# Patient Record
Sex: Male | Born: 1970 | Race: White | Hispanic: No | Marital: Married | Smoking: Never smoker
Health system: Southern US, Community
[De-identification: ages and names within clinical notes are randomized; demographics above are authoritative.]

## PROBLEM LIST (undated history)

## (undated) DIAGNOSIS — I10 Essential (primary) hypertension: Secondary | ICD-10-CM

## (undated) HISTORY — PX: NO PAST SURGERIES: SHX2092

---

## 2019-01-09 ENCOUNTER — Other Ambulatory Visit: Payer: Self-pay | Admitting: Obstetrics and Gynecology

## 2019-01-09 ENCOUNTER — Ambulatory Visit
Admission: RE | Admit: 2019-01-09 | Discharge: 2019-01-09 | Disposition: A | Discharge status: Home / Self Care | Payer: BC Managed Care – PPO | Source: Ambulatory Visit | Attending: Obstetrics and Gynecology | Admitting: Obstetrics and Gynecology

## 2019-01-09 ENCOUNTER — Other Ambulatory Visit: Payer: Self-pay

## 2019-01-09 DIAGNOSIS — M545 Low back pain, unspecified: Secondary | ICD-10-CM

## 2019-01-09 IMAGING — CR DG LUMBAR SPINE COMPLETE 4+V
5 series · 5 of 5 positions shown · non-contrast
Comparison: None.

CLINICAL DATA: Chronic low back pain radiating to the buttocks and
hips, worse on the right.

EXAM:
LUMBAR SPINE - COMPLETE 4+ VIEW

[t lumbar spine ap]
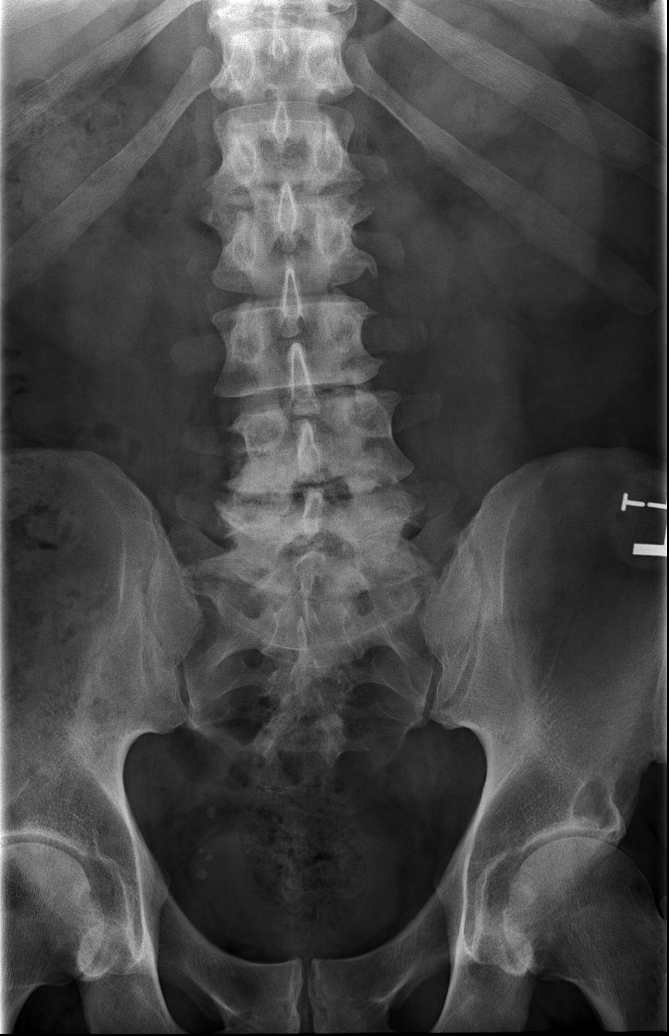

[t lumbar spine obl (1 of 2)]
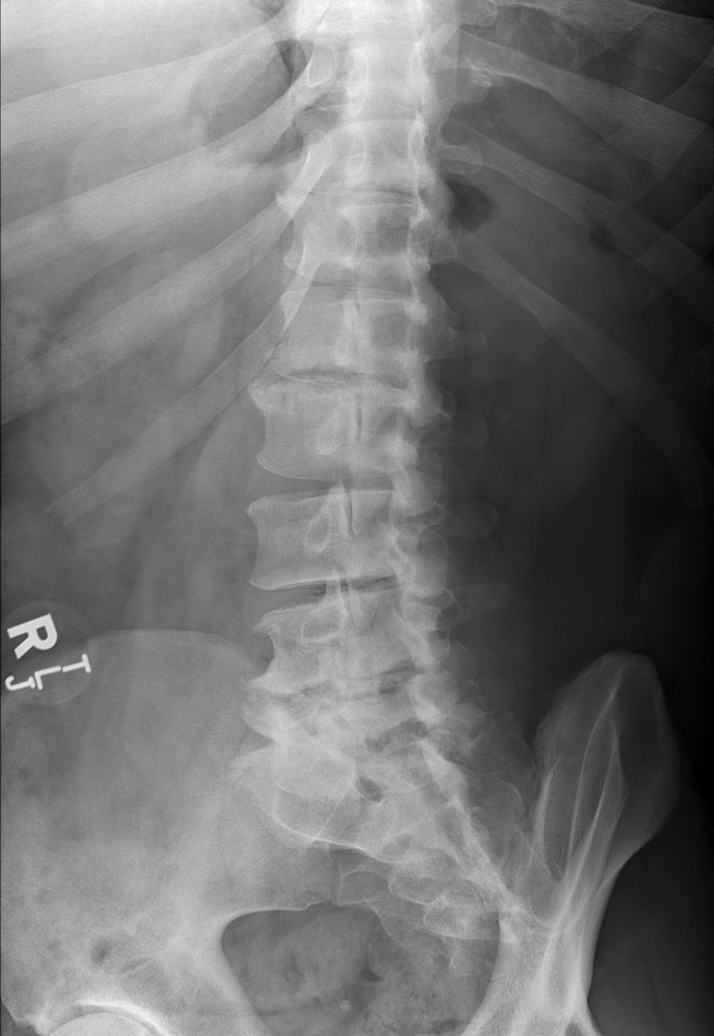

[t lumbar spine obl (2 of 2)]
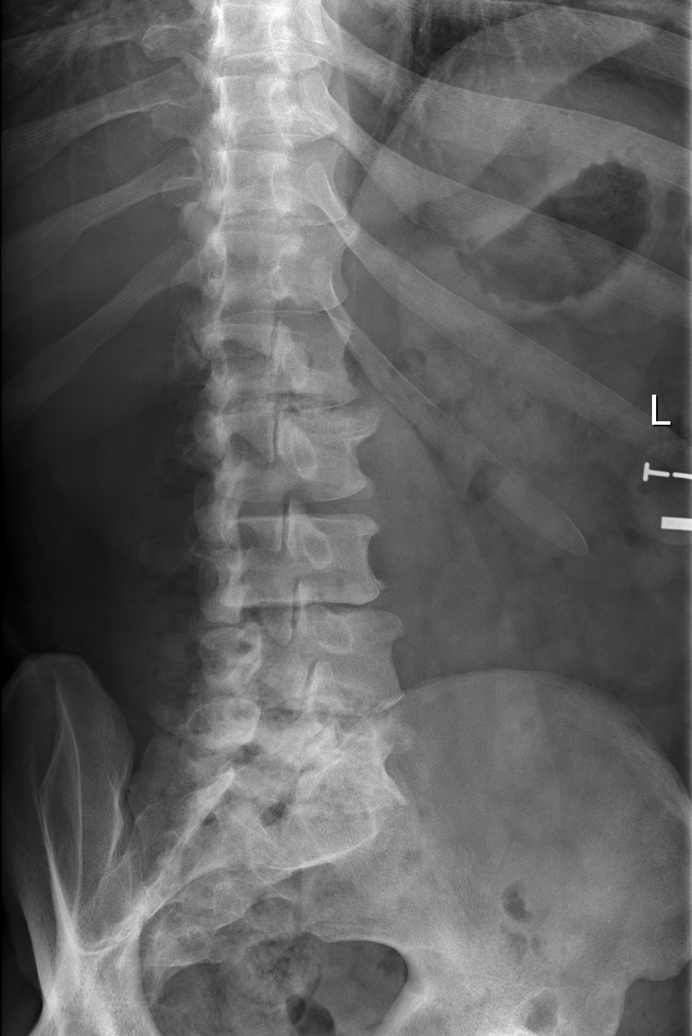

[t lumbar spine lat]
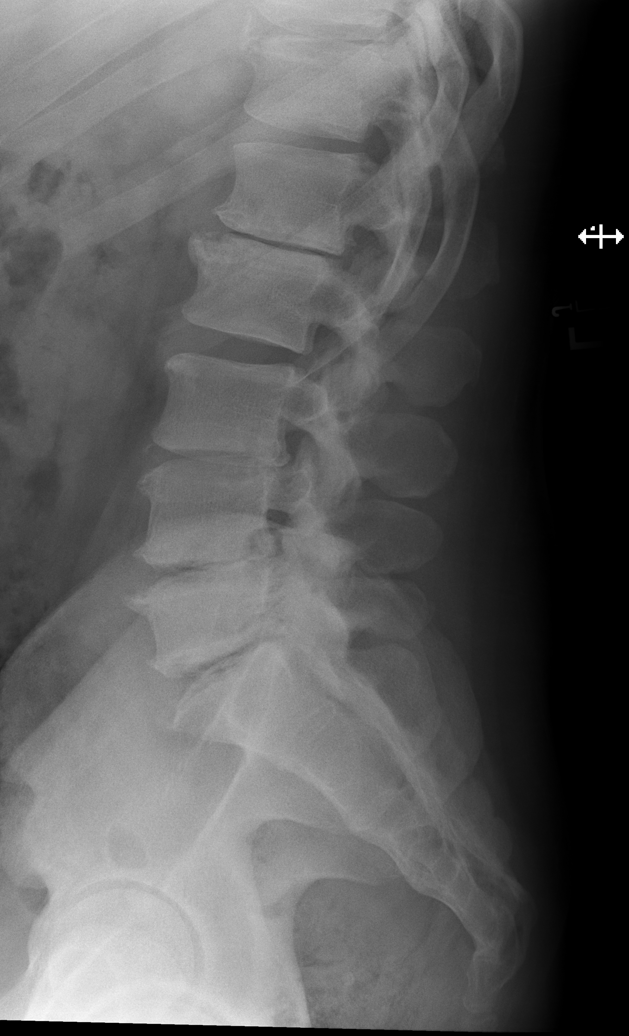

[t lumbar l-5 s-1 spot]
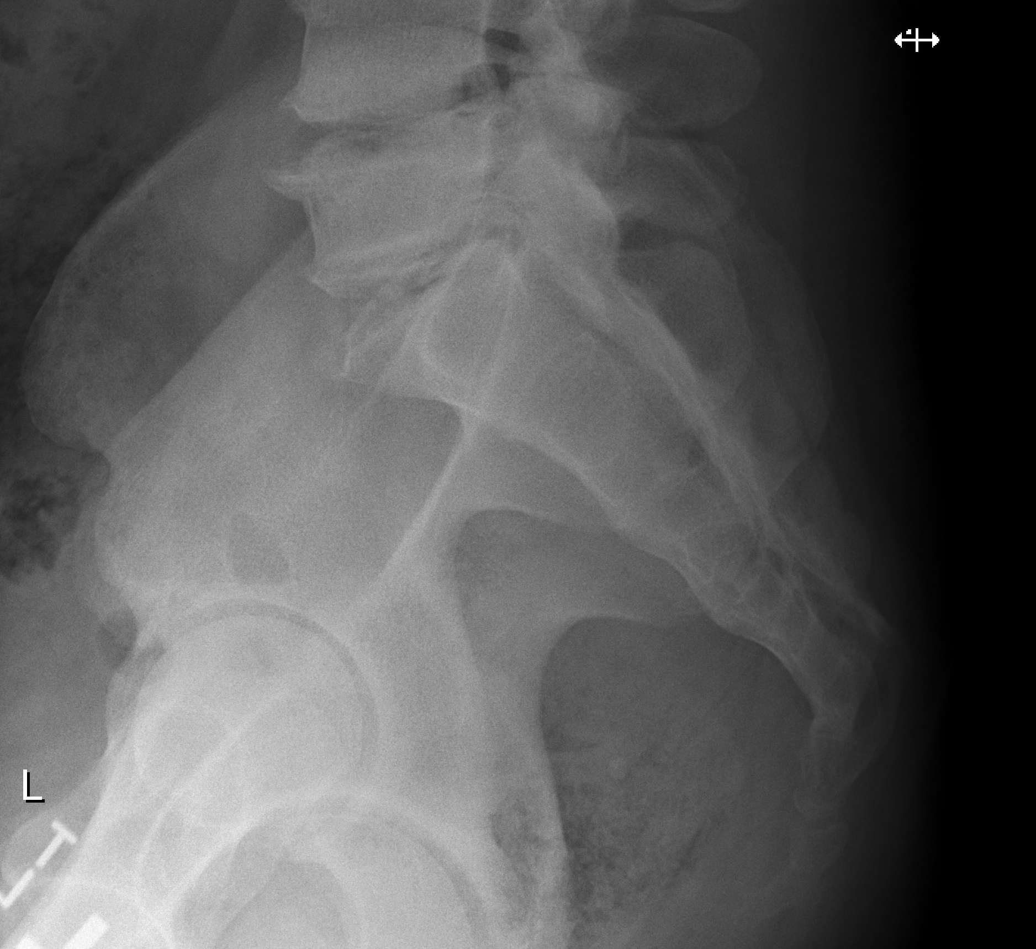

[5 of 5 positions shown; findings below may reference images not displayed]

FINDINGS: Five lumbar type vertebral bodies. Mild thoracolumbar curvature
convex to the right and lower lumbar curvature convex to the left.
Mild rightward translation of L3 relative to L4. 3 mm degenerative
anterolisthesis L1-2. Advanced disc degeneration with loss of disc
height, vacuum phenomenon and endplate sclerosis at L1-2, L4-5 and
L5-S1. Moderate disc degeneration at L3-4. Lower lumbar facet
osteoarthritis. Sacroiliac joints appear negative.
IMPRESSION: Advanced degenerative disc disease at L1-2, L4-5 and L5-S1. Findings
could certainly be associated with back pain and potentially with
nerve compression or referred pain.

## 2019-02-23 ENCOUNTER — Other Ambulatory Visit: Payer: Self-pay | Admitting: Neurological Surgery

## 2019-02-23 DIAGNOSIS — M48061 Spinal stenosis, lumbar region without neurogenic claudication: Secondary | ICD-10-CM

## 2019-02-24 ENCOUNTER — Ambulatory Visit
Admission: RE | Admit: 2019-02-24 | Discharge: 2019-02-24 | Disposition: A | Discharge status: Home / Self Care | Payer: BC Managed Care – PPO | Source: Ambulatory Visit | Attending: Neurological Surgery | Admitting: Neurological Surgery

## 2019-02-24 DIAGNOSIS — M48061 Spinal stenosis, lumbar region without neurogenic claudication: Secondary | ICD-10-CM

## 2019-02-24 IMAGING — CT CT L SPINE W/O CM
4 of 5 series · 13 of 33 positions shown, 15 images · non-contrast
Comparison: Plain films lumbar spine [DATE].

CLINICAL DATA: Chronic low back and intermittent bilateral hip
pain. No known injury.

EXAM:
CT LUMBAR SPINE WITHOUT CONTRAST
TECHNIQUE: Multidetector CT imaging of the lumbar spine was performed without
intravenous contrast administration. Multiplanar CT image
reconstructions were also generated.

[Series 3: l-spine 2.00 br40 s3 lspine st · axial · 0.37mm/px · z∈[+1245,+1357]mm · 3 of 112 slices shown, 4 images]
[im 28/112  soft-tissue]
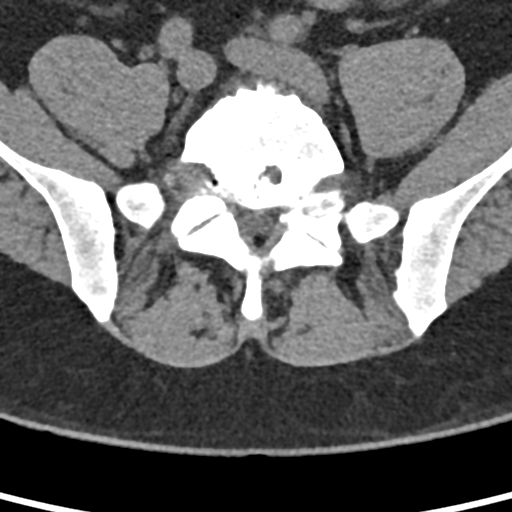
[im 28/112  bone]
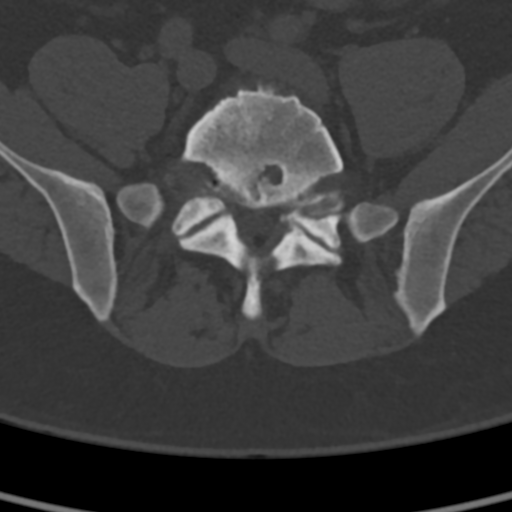
[im 56/112  bone]
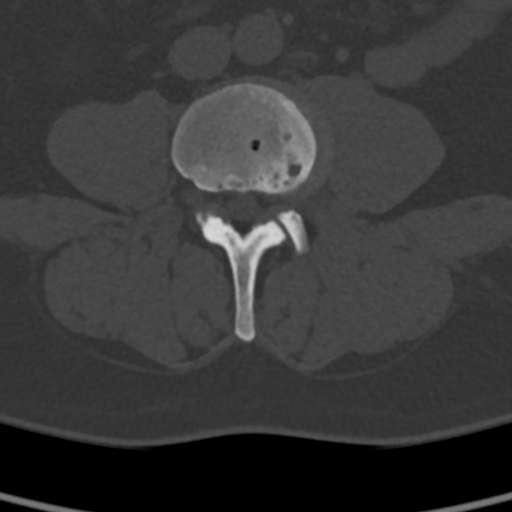
[im 84/112  bone]
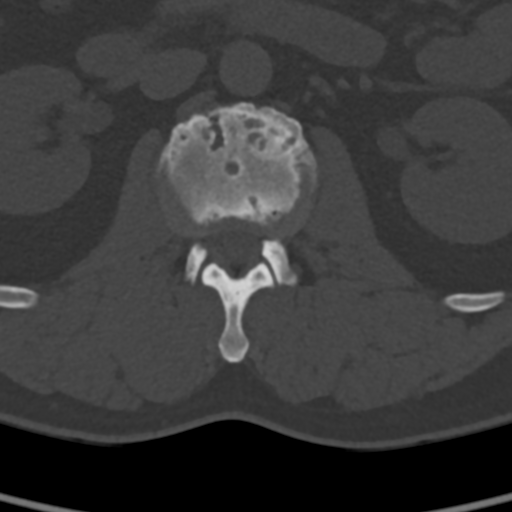

[Series 4: l-spine 2.00 br60 s3 lspine bone · axial · 0.37mm/px · z∈[+1245,+1301]mm · 2 of 112 slices shown]
[im 28/112  bone]
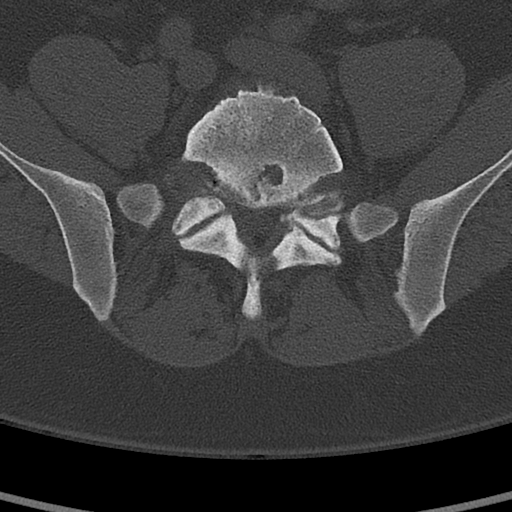
[im 56/112  bone]
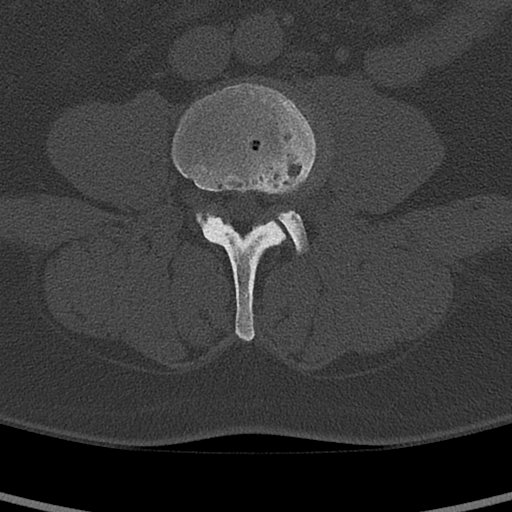

[Series 5: l-spine 2.00 br60 s3 sag bone · sagittal · 0.34mm/px · 5 of 88 slices shown, 6 images]
[im 30/88  bone]
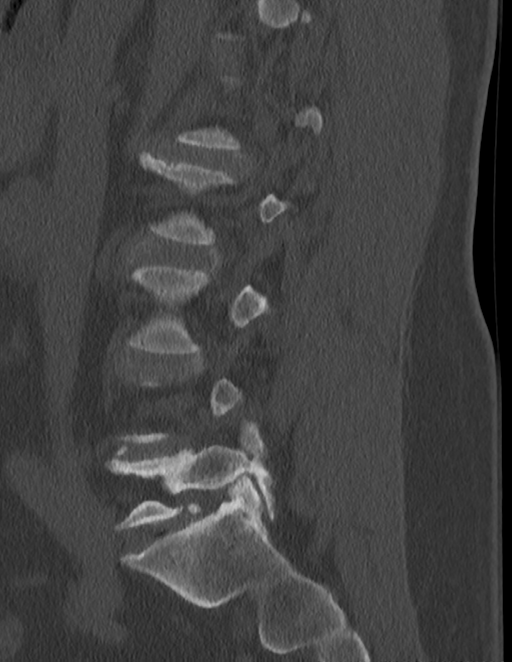
[im 37/88  bone]
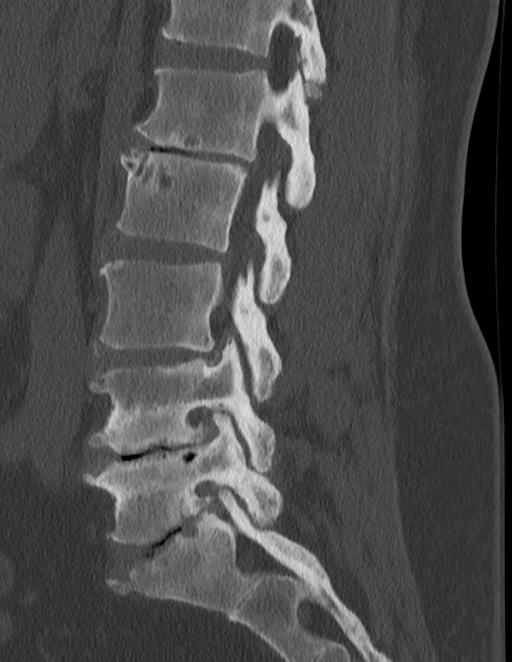
[im 44/88  soft-tissue]
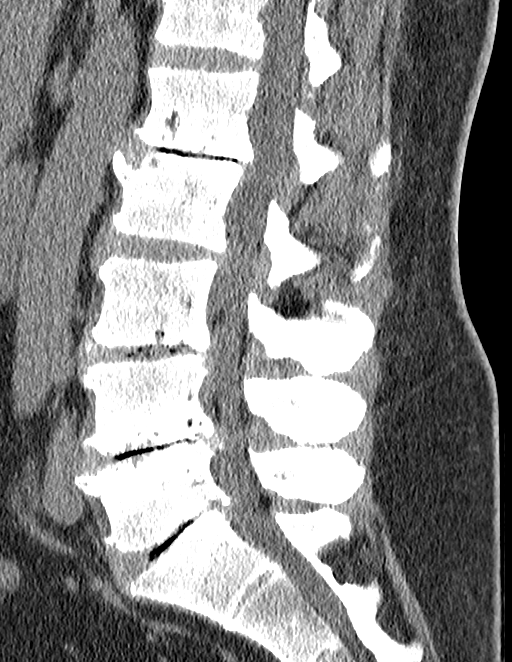
[im 44/88  bone]
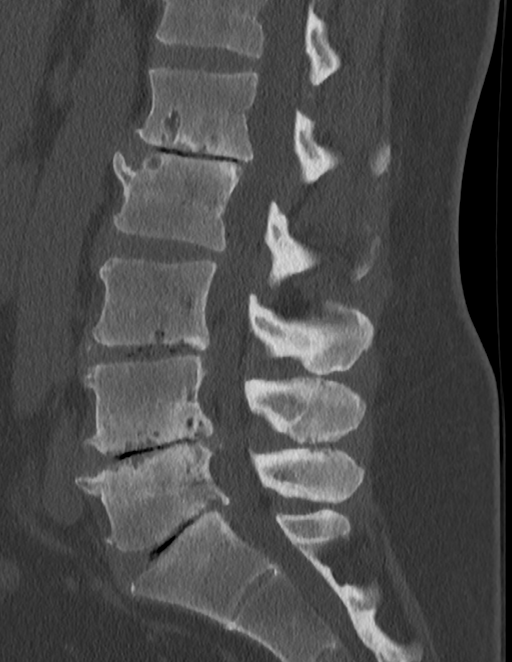
[im 51/88  bone]
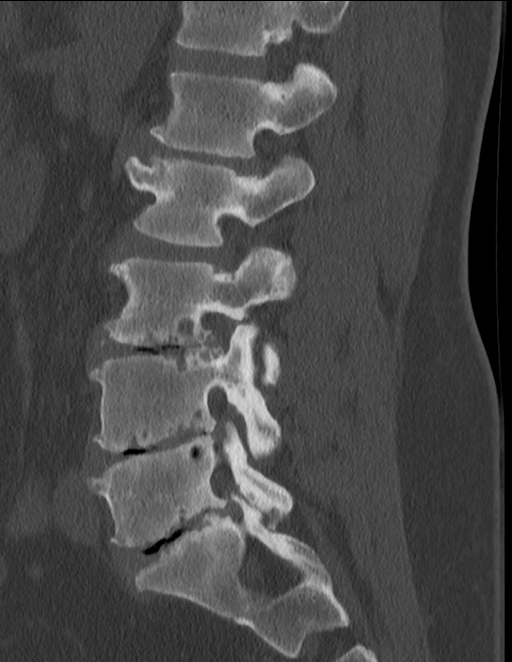
[im 59/88  bone]
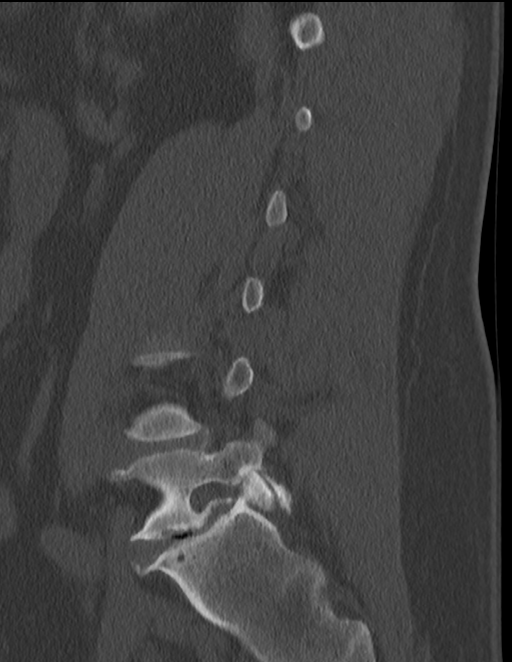

[Series 7: l-spine 2.00 br60 s3 cor bone · coronal · 0.35mm/px · 3 of 86 slices shown]
[im 18/86  bone]
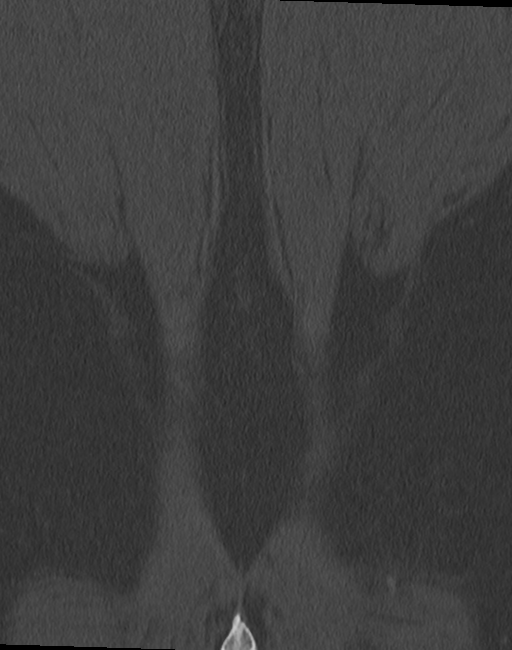
[im 35/86  bone]
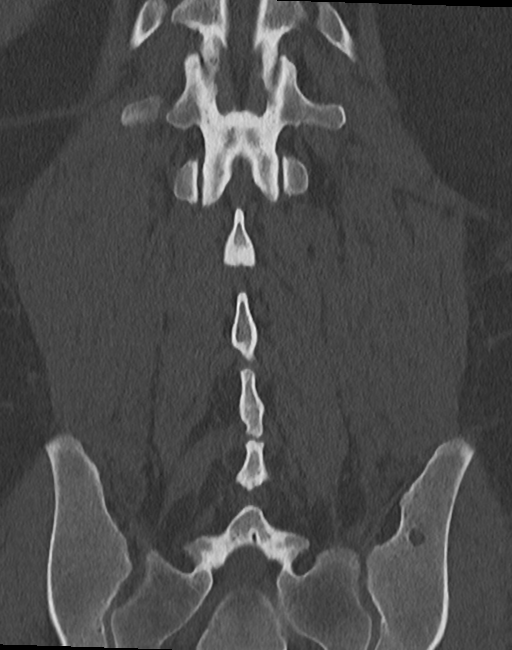
[im 52/86  bone]
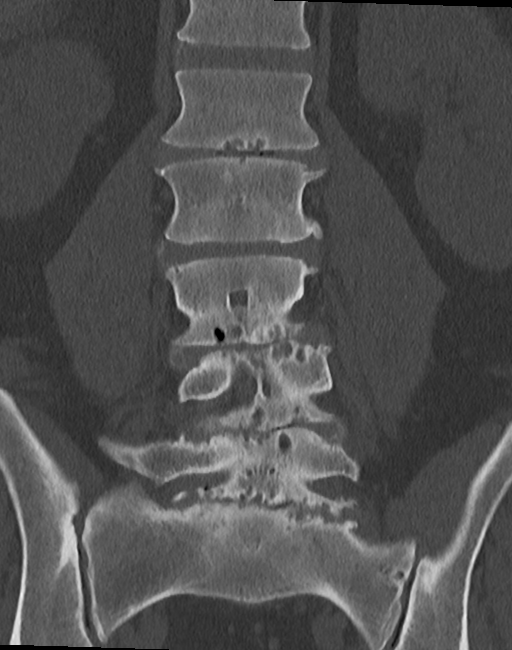

[13 of 33 positions shown; findings below may reference images not displayed]

FINDINGS: Segmentation: Standard.

Alignment: No listhesis. Mild convex left curvature with the apex at
approximately L4-5 noted.

Vertebrae: No acute fracture or focal pathologic process.
Degenerative endplate sclerosis is seen at all levels with the
exception of L2-3. Endplate sclerosis is worst at L4-5. Scattered
small Schmorl's nodes are also seen.

Paraspinal and other soft tissues: Negative.

Disc levels: T12-L1: Mild facet arthropathy. Otherwise negative.

L1-2: Severe loss of disc space height with vacuum disc phenomenon.
There is a shallow broad-based bulge and endplate spur but the
central canal and foramina appear open.

L2-3: Broad-based left paracentral disc protrusion indents the
thecal sac and causes narrowing in the left subarticular recess
which could impact the descending left L3 root. Bulging disc and
facet degenerative disease cause moderate left foraminal narrowing.
The right foramen appears open.

L3-4: Marked loss of disc space height and vacuum disc phenomenon.
Shallow broad-based disc bulge with some endplate spurring and
ligamentum flavum thickening are seen. Facet degenerative disease is
worse on the left. There is moderate to moderately severe central
canal stenosis and left worse than right subarticular recess
narrowing. Moderate to moderately severe bilateral foraminal
narrowing is also present.

L4-5: Marked loss of disc space height with some vacuum disc
phenomenon. Disc bulge and endplate spur are somewhat more prominent
to the right. There is some facet degenerative disease. Moderately
severe central canal stenosis is present with narrowing of both
subarticular recesses, worse on the right. Severe right and moderate
to moderately severe left foraminal narrowing are also identified.

L5-S1: There is a shallow broad-based disc bulge, endplate spurring
and facet arthropathy. Mild to moderate central canal stenosis and
left worse than right subarticular recess narrowing are seen. There
is also severe bilateral foraminal narrowing.
IMPRESSION: 1. Advanced for age multilevel spondylosis.
2. Broad-based left paracentral protrusion at L2-3 causes narrowing
in the left subarticular recess which could impact the left L3 root.
There is also moderate left foraminal narrowing at this level.
3. Moderate to moderately severe central canal stenosis at L3-4
where there is left worse than right subarticular recess narrowing
and encroachment on the L4 roots. Moderate to moderately severe
bilateral foraminal narrowing is also present at L3-4.
4. Moderately severe central canal stenosis and right worse than
left narrowing of both subarticular recesses with encroachment on
the L5 roots at L4-5. There is also severe right and moderately
severe left foraminal narrowing at L4-5.
5. Left worse than right subarticular recess narrowing at L5-S1
where there is also severe bilateral foraminal narrowing.

## 2020-12-06 ENCOUNTER — Other Ambulatory Visit: Payer: Self-pay | Admitting: Neurological Surgery

## 2020-12-06 ENCOUNTER — Other Ambulatory Visit (HOSPITAL_COMMUNITY): Payer: Self-pay | Admitting: Neurological Surgery

## 2020-12-06 ENCOUNTER — Other Ambulatory Visit (HOSPITAL_COMMUNITY): Payer: Self-pay | Admitting: Neurosurgery

## 2020-12-06 ENCOUNTER — Other Ambulatory Visit: Payer: Self-pay | Admitting: Pediatrics

## 2020-12-06 DIAGNOSIS — Q288 Other specified congenital malformations of circulatory system: Secondary | ICD-10-CM

## 2020-12-18 ENCOUNTER — Other Ambulatory Visit: Payer: Self-pay

## 2020-12-18 ENCOUNTER — Ambulatory Visit (HOSPITAL_COMMUNITY)
Admission: RE | Admit: 2020-12-18 | Discharge: 2020-12-18 | Disposition: A | Discharge status: Home / Self Care | Payer: BC Managed Care – PPO | Source: Ambulatory Visit | Attending: Neurological Surgery | Admitting: Neurological Surgery

## 2020-12-18 DIAGNOSIS — Q288 Other specified congenital malformations of circulatory system: Secondary | ICD-10-CM | POA: Diagnosis not present

## 2020-12-18 IMAGING — MR MR MRA SPI CANAL W/O OR W/CM
44 of 48 series · 44 of 48 positions shown · IV contrast (gadavist)
Comparison: Thoracic and lumbar spine MRI [DATE] through
[DATE]. CT lumbar spine [DATE].

CLINICAL DATA: 50-year-old male with chronic low back pain, lower
extremity numbness and tingling. Spine MRI without and with contrast
this month and last is highly suspicious for a spinal vascular
malformation, resulting in abnormal leptomeningeal vessels and edema
at the distal spinal cord/conus.

EXAM:
MRA SPINE WITHOUT AND WITH CONTRAST
TECHNIQUE: Multiplanar and multiecho pulse sequences of the spine were obtained
without and with intravenous contrast. Angiographic images of the
spine were obtained using MRA technique without and with intravenous
contrast.
CONTRAST:  10mL GADAVIST GADOBUTROL 1 MMOL/ML IV SOLN

[Series 5: T2 · sagittal · 4.0mm · 0.76mm/px · 1 of 16 slices shown]
[im 1/16]
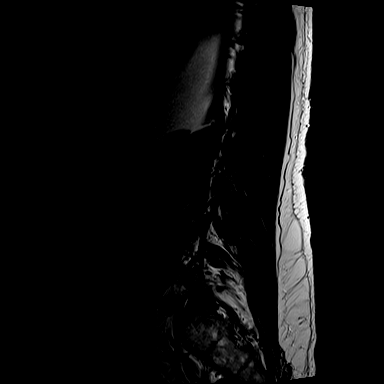

[Series 10: angio_twist_cor_iso_ttc=8.1s · sagittal · 1.3mm · 1.28mm/px · 1 of 56 slices shown]
[im 1/56]
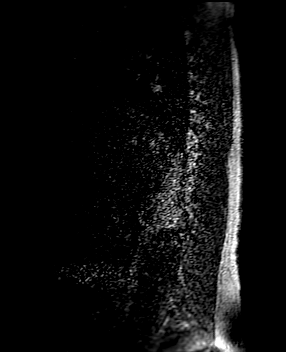

[Series 11: angio_twist_cor_iso_ttc=(id) · sagittal · 1.3mm · 1.28mm/px · 1 of 56 slices shown (1 of 21)]
[im 1/56]
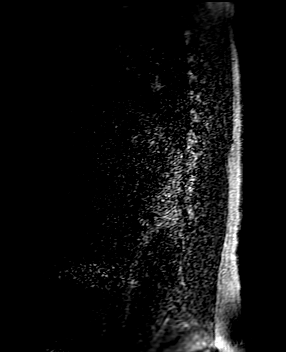

[Series 12: angio_twist_cor_iso_ttc=(id)_sub · sagittal · 1.3mm · 1.28mm/px · 1 of 56 slices shown (1 of 20)]
[im 1/56]
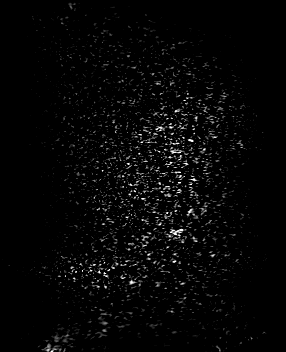

[Series 13: angio_twist_cor_iso_sub_mip_cor · coronal · 365.6mm · 1.28mm/px · 1 of 29 slices shown]
[im 1/29]
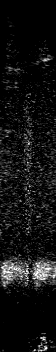

[Series 14: angio_twist_cor_iso_ttc=(id) · sagittal · 1.3mm · 1.28mm/px · 1 of 56 slices shown (2 of 21)]
[im 1/56]
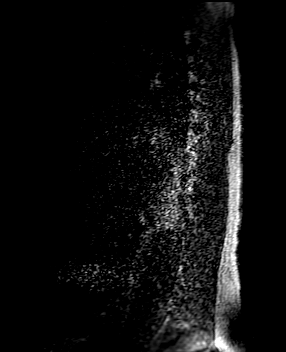

[Series 15: angio_twist_cor_iso_ttc=(id)_sub · sagittal · 1.3mm · 1.28mm/px · 1 of 56 slices shown (2 of 20)]
[im 1/56]
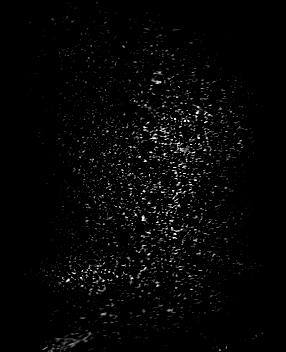

[Series 16: angio_twist_cor_iso_ttc=(id) · sagittal · 1.3mm · 1.28mm/px · 1 of 56 slices shown (3 of 21)]
[im 1/56]
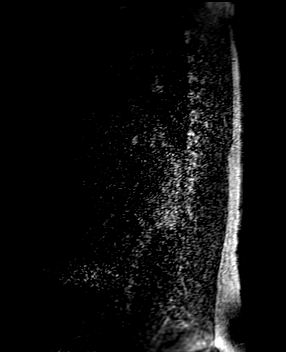

[Series 17: angio_twist_cor_iso_ttc=(id)_sub · sagittal · 1.3mm · 1.28mm/px · 1 of 56 slices shown (3 of 20)]
[im 1/56]
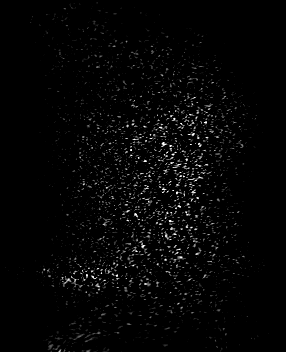

[Series 18: angio_twist_cor_iso_ttc=(id) · sagittal · 1.3mm · 1.28mm/px · 1 of 56 slices shown (4 of 21)]
[im 1/56]
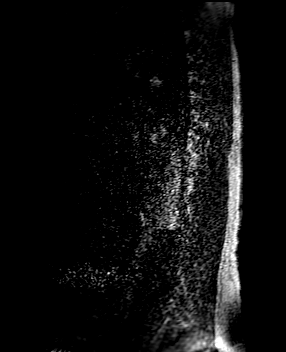

[Series 19: angio_twist_cor_iso_ttc=(id)_sub · sagittal · 1.3mm · 1.28mm/px · 1 of 56 slices shown (4 of 20)]
[im 1/56]
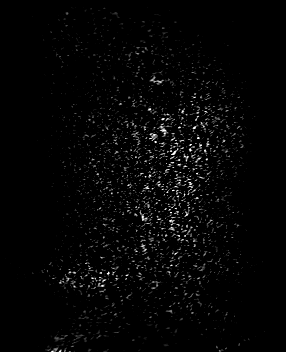

[Series 20: angio_twist_cor_iso_ttc=(id) · sagittal · 1.3mm · 1.28mm/px · 1 of 56 slices shown (5 of 21)]
[im 1/56]
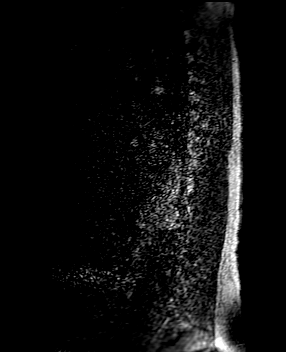

[Series 21: angio_twist_cor_iso_ttc=(id)_sub · sagittal · 1.3mm · 1.28mm/px · 1 of 56 slices shown (5 of 20)]
[im 1/56]
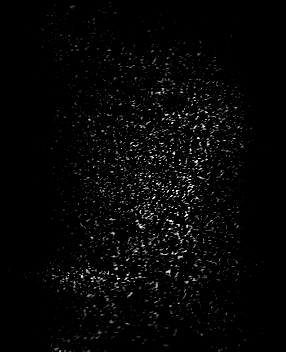

[Series 22: angio_twist_cor_iso_ttc=(id) · sagittal · 1.3mm · 1.28mm/px · 1 of 56 slices shown (6 of 21)]
[im 1/56]
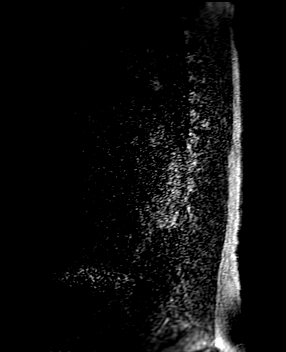

[Series 23: angio_twist_cor_iso_ttc=(id)_sub · sagittal · 1.3mm · 1.28mm/px · 1 of 56 slices shown (6 of 20)]
[im 1/56]
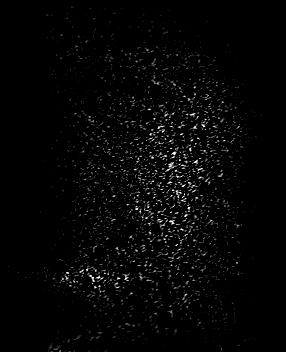

[Series 24: angio_twist_cor_iso_ttc=(id) · sagittal · 1.3mm · 1.28mm/px · 1 of 56 slices shown (7 of 21)]
[im 1/56]
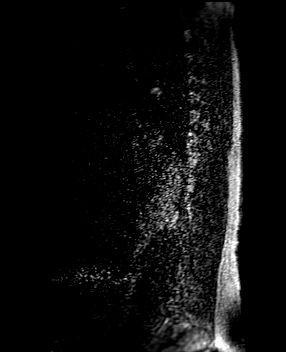

[Series 25: angio_twist_cor_iso_ttc=(id)_sub · sagittal · 1.3mm · 1.28mm/px · 1 of 56 slices shown (7 of 20)]
[im 1/56]
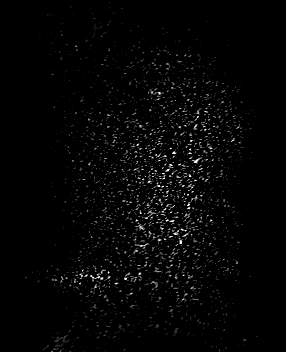

[Series 26: angio_twist_cor_iso_ttc=(id) · sagittal · 1.3mm · 1.28mm/px · 1 of 56 slices shown (8 of 21)]
[im 1/56]
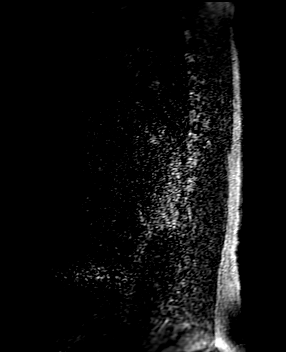

[Series 27: angio_twist_cor_iso_ttc=(id)_sub · sagittal · 1.3mm · 1.28mm/px · 1 of 56 slices shown (8 of 20)]
[im 1/56]
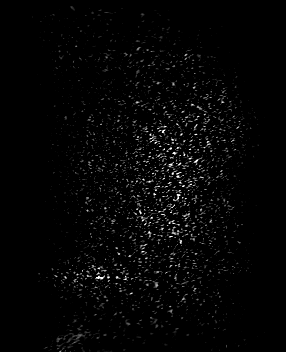

[Series 28: angio_twist_cor_iso_ttc=(id) · sagittal · 1.3mm · 1.28mm/px · 1 of 56 slices shown (9 of 21)]
[im 1/56]
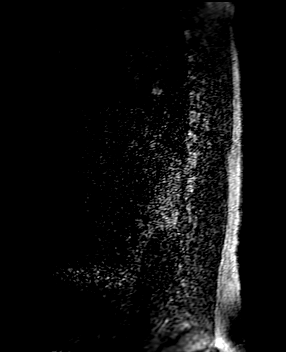

[Series 29: angio_twist_cor_iso_ttc=(id)_sub · sagittal · 1.3mm · 1.28mm/px · 1 of 56 slices shown (9 of 20)]
[im 1/56]
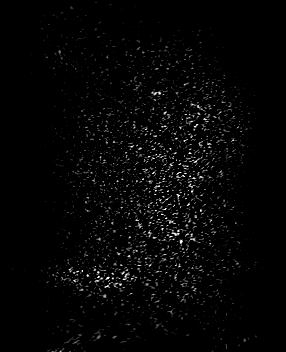

[Series 30: angio_twist_cor_iso_ttc=(id) · sagittal · 1.3mm · 1.28mm/px · 1 of 56 slices shown (10 of 21)]
[im 1/56]
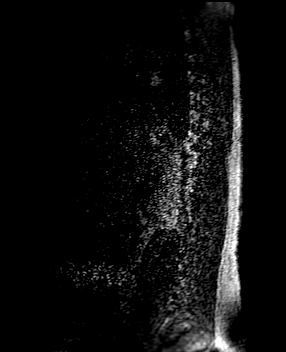

[Series 31: angio_twist_cor_iso_ttc=(id)_sub · sagittal · 1.3mm · 1.28mm/px · 1 of 56 slices shown (10 of 20)]
[im 1/56]
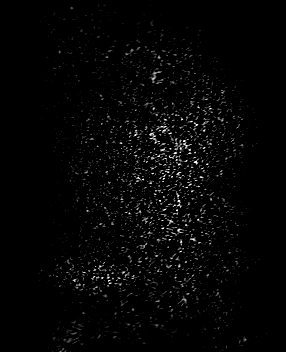

[Series 32: angio_twist_cor_iso_ttc=(id) · sagittal · 1.3mm · 1.28mm/px · 1 of 56 slices shown (11 of 21)]
[im 1/56]
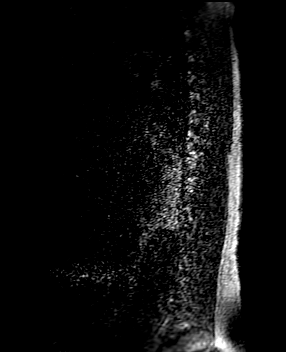

[Series 33: angio_twist_cor_iso_ttc=(id)_sub · sagittal · 1.3mm · 1.28mm/px · 1 of 56 slices shown (11 of 20)]
[im 1/56]
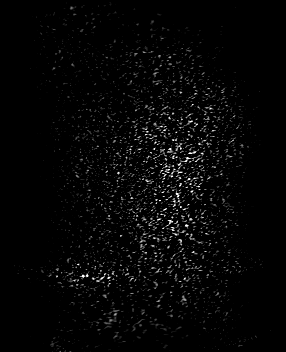

[Series 34: angio_twist_cor_iso_ttc=(id) · sagittal · 1.3mm · 1.28mm/px · 1 of 56 slices shown (12 of 21)]
[im 1/56]
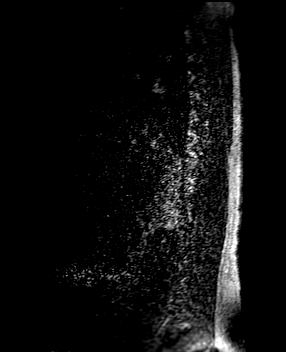

[Series 35: angio_twist_cor_iso_ttc=(id)_sub · sagittal · 1.3mm · 1.28mm/px · 1 of 56 slices shown (12 of 20)]
[im 1/56]
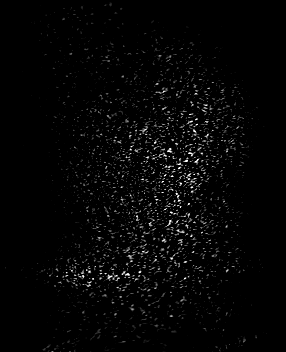

[Series 36: angio_twist_cor_iso_ttc=(id) · sagittal · 1.3mm · 1.28mm/px · 1 of 56 slices shown (13 of 21)]
[im 1/56]
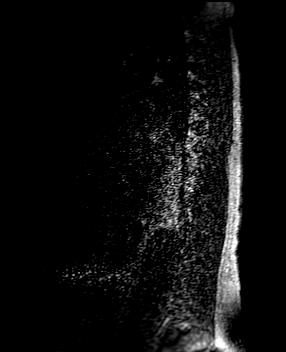

[Series 37: angio_twist_cor_iso_ttc=(id)_sub · sagittal · 1.3mm · 1.28mm/px · 1 of 56 slices shown (13 of 20)]
[im 1/56]
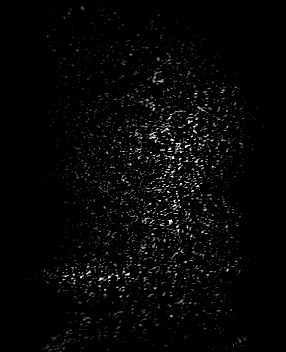

[Series 38: angio_twist_cor_iso_ttc=(id) · sagittal · 1.3mm · 1.28mm/px · 1 of 56 slices shown (14 of 21)]
[im 1/56]
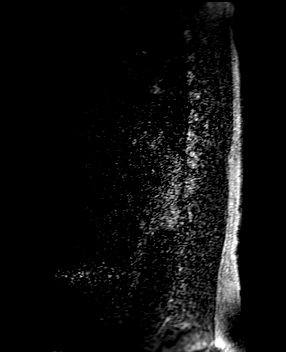

[Series 39: angio_twist_cor_iso_ttc=(id)_sub · sagittal · 1.3mm · 1.28mm/px · 1 of 56 slices shown (14 of 20)]
[im 1/56]
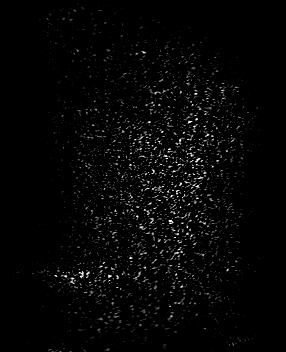

[Series 40: angio_twist_cor_iso_ttc=(id) · sagittal · 1.3mm · 1.28mm/px · 1 of 56 slices shown (15 of 21)]
[im 1/56]
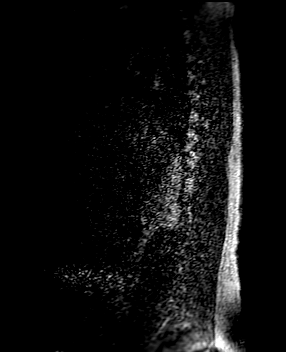

[Series 41: angio_twist_cor_iso_ttc=(id)_sub · sagittal · 1.3mm · 1.28mm/px · 1 of 56 slices shown (15 of 20)]
[im 1/56]
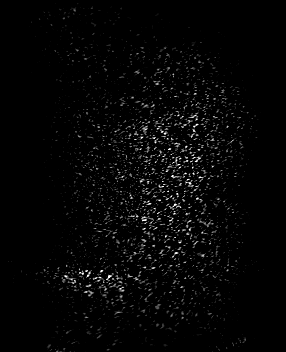

[Series 42: angio_twist_cor_iso_ttc=(id) · sagittal · 1.3mm · 1.28mm/px · 1 of 56 slices shown (16 of 21)]
[im 1/56]
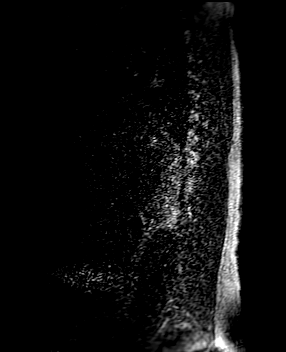

[Series 43: angio_twist_cor_iso_ttc=(id)_sub · sagittal · 1.3mm · 1.28mm/px · 1 of 56 slices shown (16 of 20)]
[im 1/56]
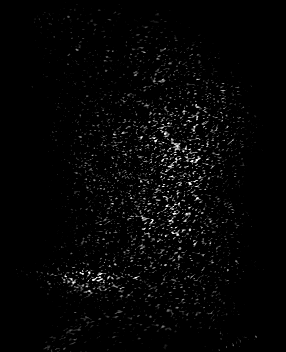

[Series 44: angio_twist_cor_iso_ttc=(id) · sagittal · 1.3mm · 1.28mm/px · 1 of 56 slices shown (17 of 21)]
[im 1/56]
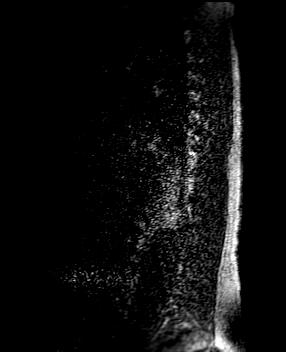

[Series 45: angio_twist_cor_iso_ttc=(id)_sub · sagittal · 1.3mm · 1.28mm/px · 1 of 56 slices shown (17 of 20)]
[im 1/56]
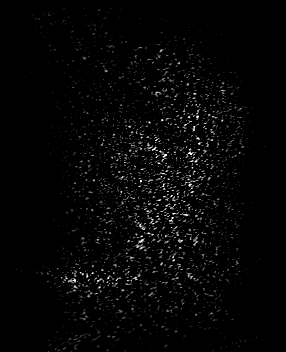

[Series 46: angio_twist_cor_iso_ttc=(id) · sagittal · 1.3mm · 1.28mm/px · 1 of 56 slices shown (18 of 21)]
[im 1/56]
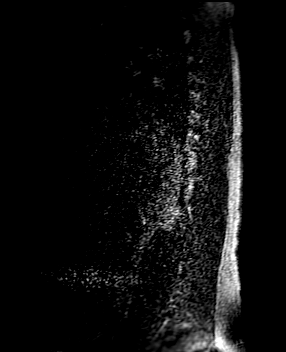

[Series 47: angio_twist_cor_iso_ttc=(id)_sub · sagittal · 1.3mm · 1.28mm/px · 1 of 56 slices shown (18 of 20)]
[im 1/56]
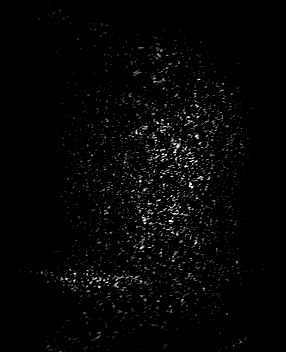

[Series 48: angio_twist_cor_iso_ttc=(id) · sagittal · 1.3mm · 1.28mm/px · 1 of 56 slices shown (19 of 21)]
[im 1/56]
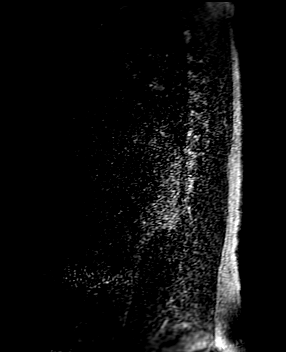

[Series 49: angio_twist_cor_iso_ttc=(id)_sub · sagittal · 1.3mm · 1.28mm/px · 1 of 56 slices shown (19 of 20)]
[im 1/56]
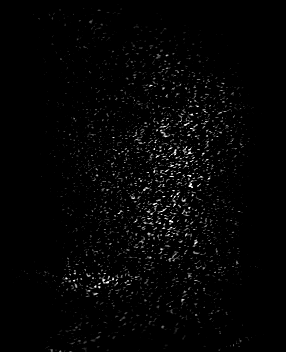

[Series 50: angio_twist_cor_iso_ttc=(id) · sagittal · 1.3mm · 1.28mm/px · 1 of 56 slices shown (20 of 21)]
[im 1/56]
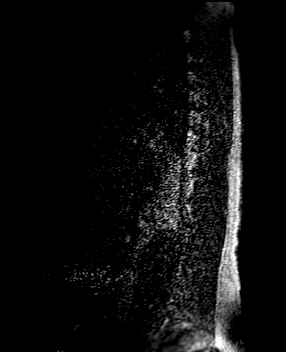

[Series 51: angio_twist_cor_iso_ttc=(id)_sub · sagittal · 1.3mm · 1.28mm/px · 1 of 56 slices shown (20 of 20)]
[im 1/56]
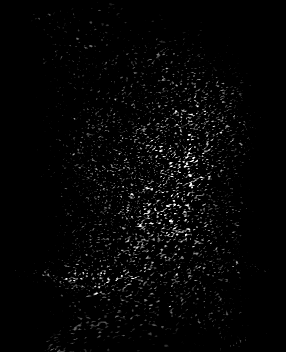

[Series 52: angio_twist_cor_iso_ttc=(id) · sagittal · 1.3mm · 1.28mm/px · 1 of 56 slices shown (21 of 21)]
[im 1/56]
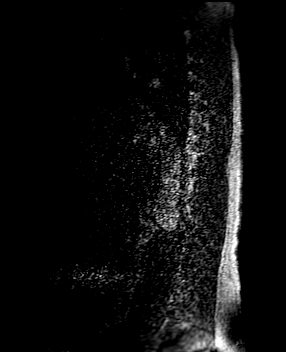

[44 of 48 positions shown; findings below may reference images not displayed]

FINDINGS: Time resolved MRA of the lower thoracic and lumbar spine performed.

Initial 1.5 T image acquisition attempt on [DATE] was noisy and
insufficient.

The patient returned on [DATE] for repeat attempt on 3 Tesla
MRI, which is of good diagnostic quality.

Repeat sagittal T2 weighted image provided, demonstrating advanced
lumbar spine degeneration, and continued abnormal enlargement and T2
hyperintensity of the lower thoracic spinal cord and conus. Again,
evidence of numerous increased small leptomeningeal vessels along
the lower cord and conus (series 3, image 8).

Time resolved MRA images obtained in the sagittal plane. See series
[63] for the complete time resolved MIP image stack.

Normal MRA appearance of the aorta, with lower thoracic and lumbar
spinal arteries enhancing at each level.

Very early in the arterial phase imaging an abnormal oval, roughly
10 mm intensely enhancing lesion becomes apparent at the level of
the left L3 lateral recess/proximal neural foramen. See series [63],
image 36 and series 3, image 10. And this lesion progressively
enhances over time, with early appearance of asymmetric cephalad
draining vessels (series [63], image 31) which on the thick sagittal
MIP time resolved sequence are seen to extend to and around the
conus. See series [63] image 11.

Correlating this appearance with the previous lumbar MRI without and
with contrast, the same lesion can be delineated: [DATE] series
19, image 10 and series 20, image 15, and [REDACTED] series 6, image
15.
IMPRESSION: Positive for a spinal vascular malformation located in the ventral
epidural space of the left L3-L4 lateral recess/proximal left
foramen. Oval 10 mm focus of intense early arterial enhancement
there, with cephalad abnormal draining vessels contiguous with those
of the abnormal lower spinal cord leptomeninges.

## 2020-12-18 MED ORDER — GADOBUTROL 1 MMOL/ML IV SOLN
10.0000 mL | Freq: Once | INTRAVENOUS | Status: AC | PRN
  Administered 2020-12-18: 10 mL via INTRAVENOUS

## 2020-12-20 IMAGING — MR MR MRA SPI CANAL W/O OR W/CM
9 of 35 series · 18 of 48 positions shown · IV contrast (gadavist)
Comparison: Thoracic and lumbar spine MRI [DATE] through
[DATE]. CT lumbar spine [DATE].

CLINICAL DATA: 50-year-old male with chronic low back pain, lower
extremity numbness and tingling. Spine MRI without and with contrast
this month and last is highly suspicious for a spinal vascular
malformation, resulting in abnormal leptomeningeal vessels and edema
at the distal spinal cord/conus.

EXAM:
MRA SPINE WITHOUT AND WITH CONTRAST
TECHNIQUE: Multiplanar and multiecho pulse sequences of the spine were obtained
without and with intravenous contrast. Angiographic images of the
spine were obtained using MRA technique without and with intravenous
contrast.
CONTRAST:  10mL GADAVIST GADOBUTROL 1 MMOL/ML IV SOLN

[Series 3: T2 · sagittal · 4.0mm · 0.55mm/px · 2 of 15 slices shown]
[im 1/15]
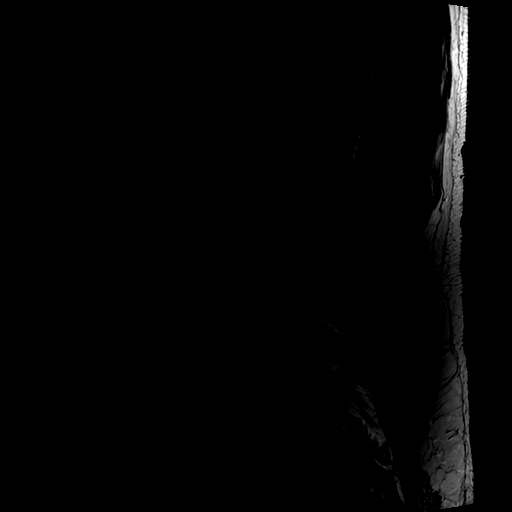
[im 15/15]
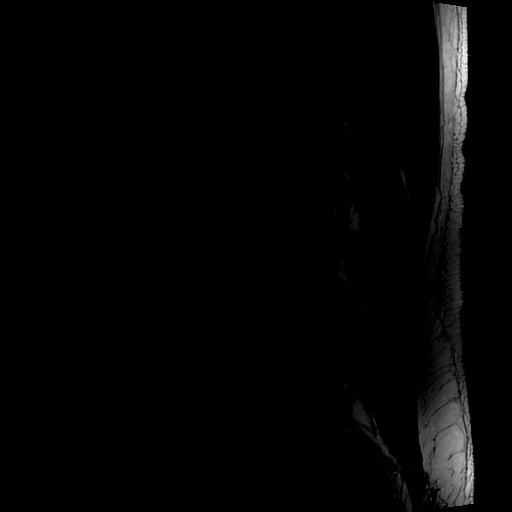

[Series 10: tricks temp. res. · sagittal · 2.3mm · 0.68mm/px · 2 of 60 slices shown]
[im 1/60]
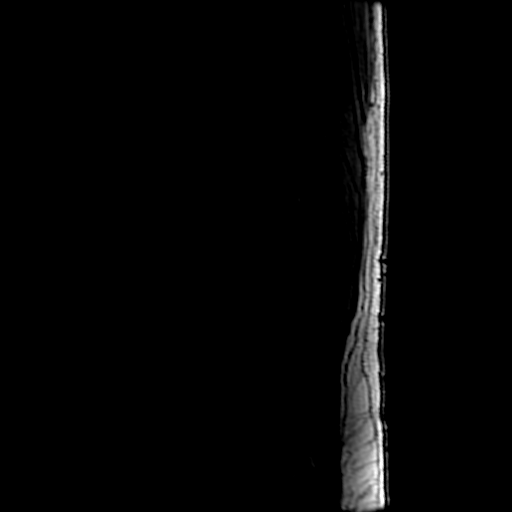
[im 60/60]
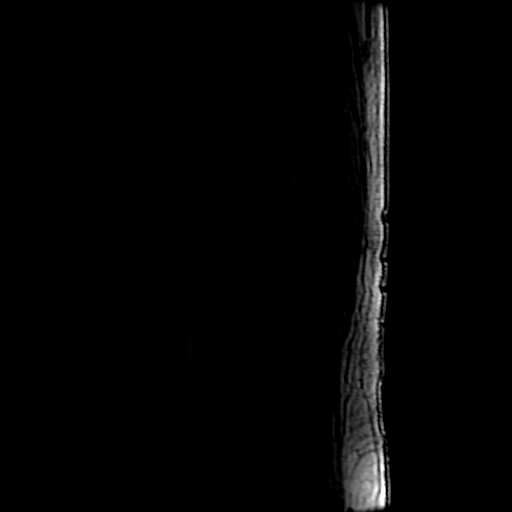

[Series 1000: sub:tricks temp. res. · sagittal · 2.3mm · 0.68mm/px · 2 of 28 slices shown]
[im 1/28]
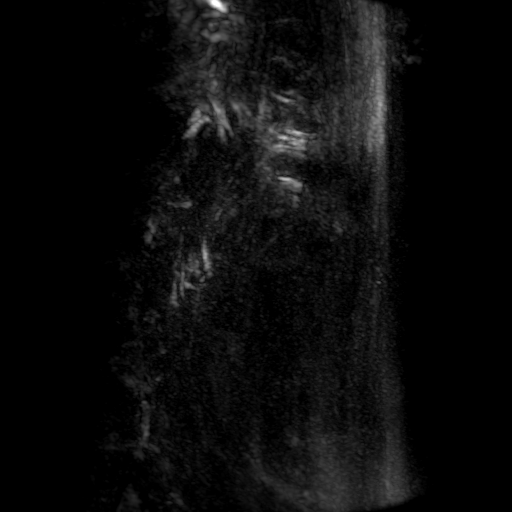
[im 28/28]
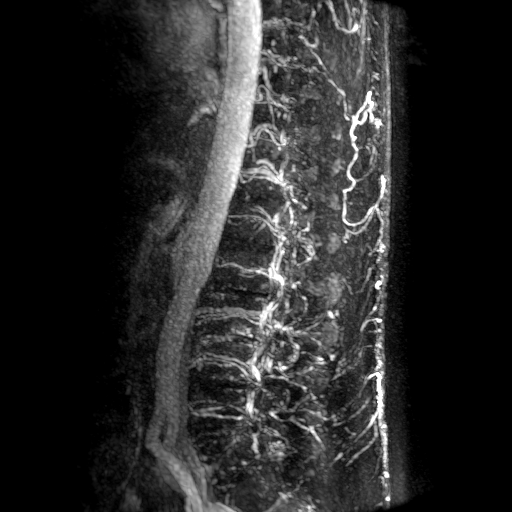

[Series 1001: ph1:sub:tricks temp. res. · sagittal · 2.3mm · 0.68mm/px · 2 of 60 slices shown]
[im 1/60]
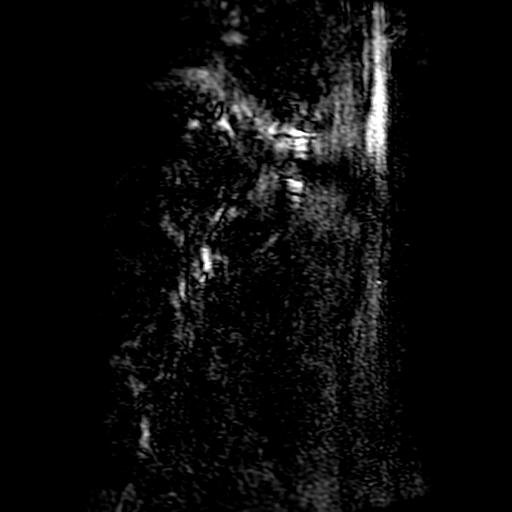
[im 60/60]
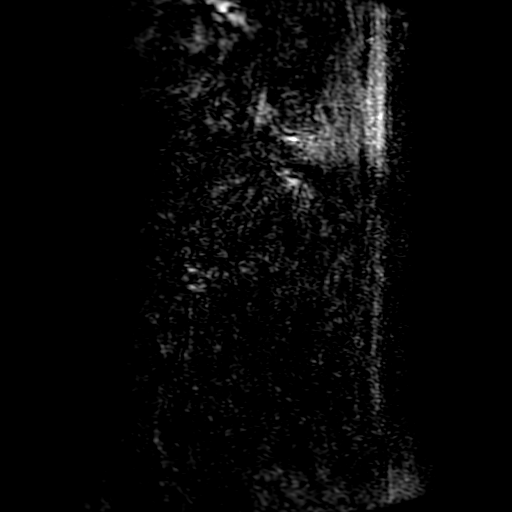

[Series 1002: ph2:sub:tricks temp. res. · sagittal · 2.3mm · 0.68mm/px · 2 of 60 slices shown]
[im 1/60]
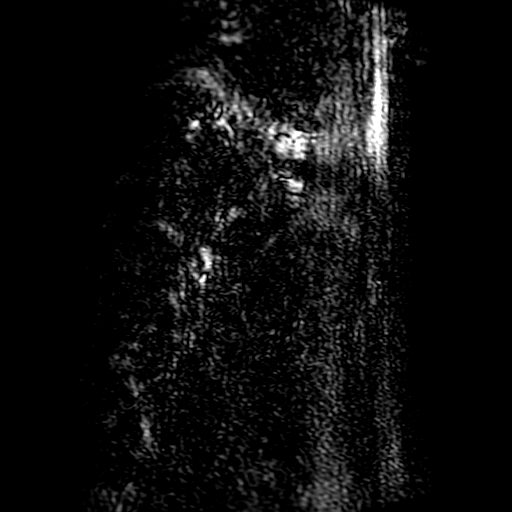
[im 60/60]
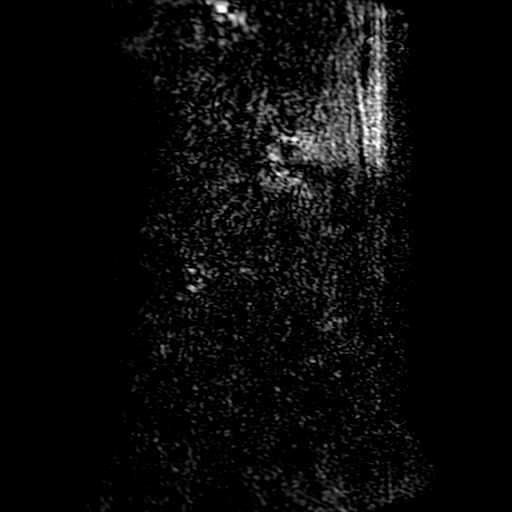

[Series 1003: ph3:sub:tricks temp. res. · sagittal · 2.3mm · 0.68mm/px · 2 of 60 slices shown]
[im 1/60]
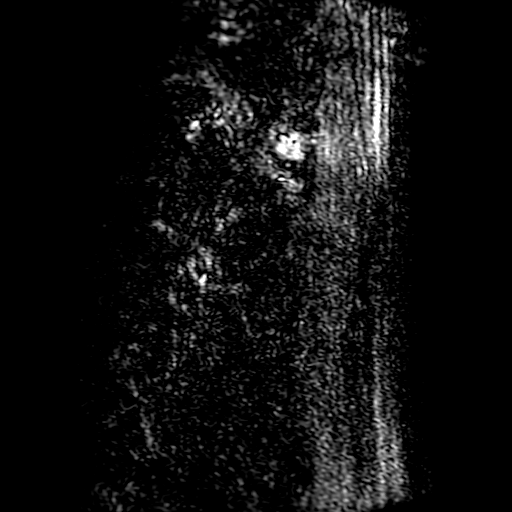
[im 60/60]
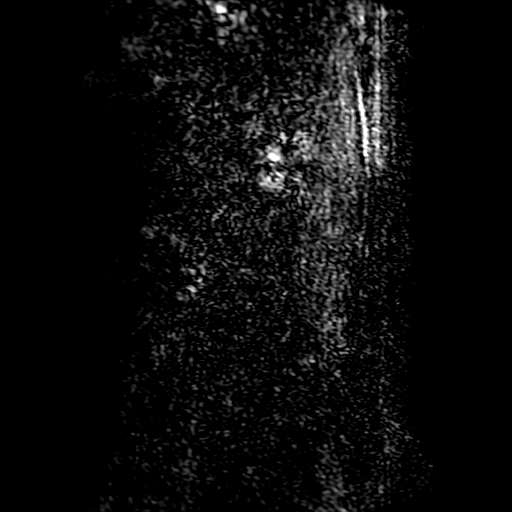

[Series 1004: ph4:sub:tricks temp. res. · sagittal · 2.3mm · 0.68mm/px · 2 of 60 slices shown]
[im 1/60]
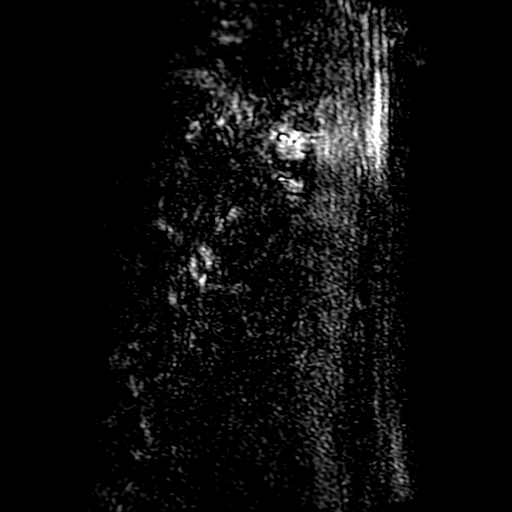
[im 60/60]
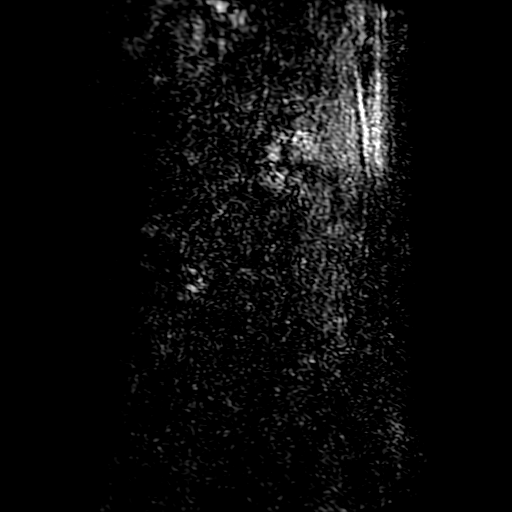

[Series 1005: ph5:sub:tricks temp. res. · sagittal · 2.3mm · 0.68mm/px · 2 of 60 slices shown]
[im 1/60]
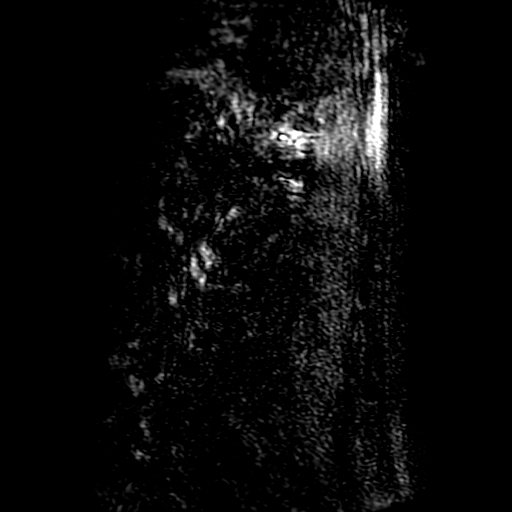
[im 60/60]
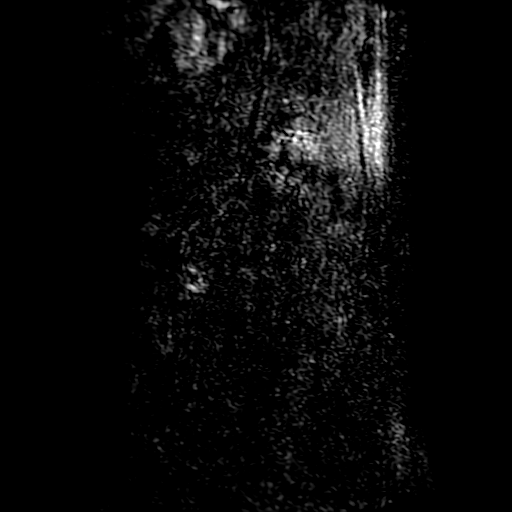

[Series 1006: ph6:sub:tricks temp. res. · sagittal · 2.3mm · 0.68mm/px · 2 of 60 slices shown]
[im 1/60]
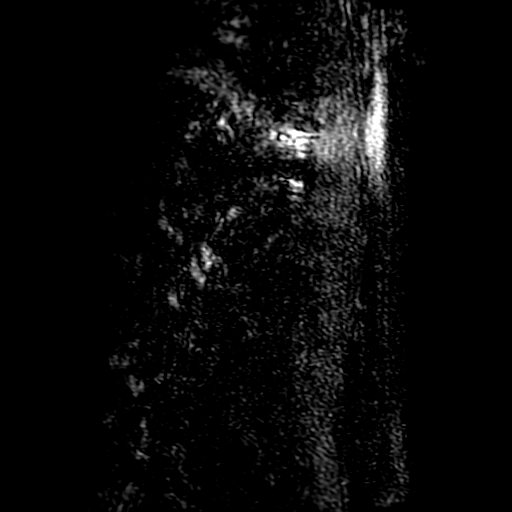
[im 60/60]
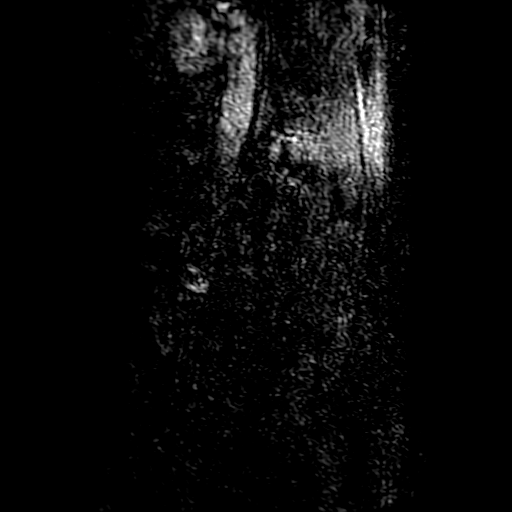

[18 of 48 positions shown; findings below may reference images not displayed]

FINDINGS: Time resolved MRA of the lower thoracic and lumbar spine performed.

Initial 1.5 T image acquisition attempt on [DATE] was noisy and
insufficient.

The patient returned on [DATE] for repeat attempt on 3 Tesla
MRI, which is of good diagnostic quality.

Repeat sagittal T2 weighted image provided, demonstrating advanced
lumbar spine degeneration, and continued abnormal enlargement and T2
hyperintensity of the lower thoracic spinal cord and conus. Again,
evidence of numerous increased small leptomeningeal vessels along
the lower cord and conus (series 3, image 8).

Time resolved MRA images obtained in the sagittal plane. See series
[63] for the complete time resolved MIP image stack.

Normal MRA appearance of the aorta, with lower thoracic and lumbar
spinal arteries enhancing at each level.

Very early in the arterial phase imaging an abnormal oval, roughly
10 mm intensely enhancing lesion becomes apparent at the level of
the left L3 lateral recess/proximal neural foramen. See series [63],
image 36 and series 3, image 10. And this lesion progressively
enhances over time, with early appearance of asymmetric cephalad
draining vessels (series [63], image 31) which on the thick sagittal
MIP time resolved sequence are seen to extend to and around the
conus. See series [63] image 11.

Correlating this appearance with the previous lumbar MRI without and
with contrast, the same lesion can be delineated: [DATE] series
19, image 10 and series 20, image 15, and [REDACTED] series 6, image
15.
IMPRESSION: Positive for a spinal vascular malformation located in the ventral
epidural space of the left L3-L4 lateral recess/proximal left
foramen. Oval 10 mm focus of intense early arterial enhancement
there, with cephalad abnormal draining vessels contiguous with those
of the abnormal lower spinal cord leptomeninges.

## 2020-12-23 ENCOUNTER — Other Ambulatory Visit (HOSPITAL_COMMUNITY): Payer: Self-pay | Admitting: Neurosurgery

## 2020-12-23 DIAGNOSIS — Q288 Other specified congenital malformations of circulatory system: Secondary | ICD-10-CM

## 2021-01-03 ENCOUNTER — Other Ambulatory Visit: Payer: Self-pay | Admitting: Neurosurgery

## 2021-01-17 ENCOUNTER — Observation Stay (HOSPITAL_COMMUNITY): Payer: BC Managed Care – PPO

## 2021-01-22 ENCOUNTER — Encounter (HOSPITAL_COMMUNITY): Payer: Self-pay | Admitting: Neurosurgery

## 2021-01-22 ENCOUNTER — Other Ambulatory Visit: Payer: Self-pay

## 2021-01-22 NOTE — Progress Notes (Signed)
PCP - Denies  Cardiologist - Denies  EP- Denies  Endocrine- Denies  Pulm- Denies  Chest x-ray - Denies  EKG - 01/24/21- Day of surgery  Stress Test - Denies  ECHO - Denies  Cardiac Cath - Denies  AICD- na PM- na LOOP- na  Dialysis- Denies  Sleep Study - Denies CPAP - Denies  LABS- 01/24/21: CBC, BMP  ASA- Denies  ERAS- No  HA1C- Denies  Anesthesia- No  Pt denies having chest pain, sob, or fever during the pre-op phone call. All instructions explained to the pt, with a verbal understanding of the material including: as of today, stop taking all Aspirin (unless instructed by your doctor) and Other Aspirin containing products, Vitamins, Fish oils, and Herbal medications. Also stop all NSAIDS i.e. Advil, Ibuprofen, Motrin, Aleve, Anaprox, Naproxen, BC, Goody Powders, and all Supplements.  Pt also instructed to wear a mask and social distance if he goes out. The opportunity to ask questions was provided.    Coronavirus Screening  Have you experienced the following symptoms:  Cough yes/no: No Fever (>100.32F)  yes/no: No Runny nose yes/no: No Sore throat yes/no: No Difficulty breathing/shortness of breath  yes/no: No  Have you or a family member traveled in the last 14 days and where? yes/no: No   If the patient indicates "YES" to the above questions, their PAT will be rescheduled to limit the exposure to others and, the surgeon will be notified. THE PATIENT WILL NEED TO BE ASYMPTOMATIC FOR 14 DAYS.   If the patient is not experiencing any of these symptoms, the PAT nurse will instruct them to NOT bring anyone with them to their appointment since they may have these symptoms or traveled as well.   Please remind your patients and families that hospital visitation restrictions are in effect and the importance of the restrictions.

## 2021-01-23 ENCOUNTER — Encounter (HOSPITAL_COMMUNITY): Payer: Self-pay | Admitting: Neurosurgery

## 2021-01-23 NOTE — Anesthesia Preprocedure Evaluation (Addendum)
Anesthesia Evaluation  Patient identified by MRN, date of birth, ID band Patient awake    Reviewed: Allergy & Precautions, NPO status , Patient's Chart, lab work & pertinent test results  Airway Mallampati: II  TM Distance: >3 FB Neck ROM: Full    Dental no notable dental hx. (+) Teeth Intact, Dental Advisory Given   Pulmonary neg pulmonary ROS,    Pulmonary exam normal breath sounds clear to auscultation       Cardiovascular hypertension, Normal cardiovascular exam Rhythm:Regular Rate:Normal     Neuro/Psych  Neuromuscular disease negative psych ROS   GI/Hepatic negative GI ROS, Neg liver ROS,   Endo/Other    Renal/GU Lab Results      Component                Value               Date                      CREATININE               1.02                01/24/2021                BUN                      17                  01/24/2021                NA                       137                 01/24/2021                K                        4.0                 01/24/2021                CL                       106                 01/24/2021                CO2                      23                  01/24/2021                Musculoskeletal negative musculoskeletal ROS (+)   Abdominal (+) + obese (BMI 36.18),   Peds  Hematology Lab Results      Component                Value               Date                      WBC  6.2                 01/24/2021                HGB                      16.0                01/24/2021                HCT                      47.5                01/24/2021                MCV                      93.3                01/24/2021                PLT                      210                 01/24/2021              Anesthesia Other Findings   Reproductive/Obstetrics                           Anesthesia Physical Anesthesia Plan  ASA:  2  Anesthesia Plan: General   Post-op Pain Management:    Induction: Intravenous  PONV Risk Score and Plan: 3 and Treatment may vary due to age or medical condition, Ondansetron and Midazolam  Airway Management Planned: Oral ETT  Additional Equipment: None  Intra-op Plan:   Post-operative Plan: Extubation in OR  Informed Consent: I have reviewed the patients History and Physical, chart, labs and discussed the procedure including the risks, benefits and alternatives for the proposed anesthesia with the patient or authorized representative who has indicated his/her understanding and acceptance.     Dental advisory given  Plan Discussed with: CRNA and Anesthesiologist  Anesthesia Plan Comments:        Anesthesia Quick Evaluation

## 2021-01-24 ENCOUNTER — Ambulatory Visit (HOSPITAL_COMMUNITY): Payer: BC Managed Care – PPO | Admitting: Certified Registered Nurse Anesthetist

## 2021-01-24 ENCOUNTER — Other Ambulatory Visit: Payer: Self-pay

## 2021-01-24 ENCOUNTER — Ambulatory Visit (HOSPITAL_COMMUNITY)
Admission: RE | Admit: 2021-01-24 | Discharge: 2021-01-24 | Disposition: A | Discharge status: Home / Self Care | Payer: BC Managed Care – PPO | Attending: Neurosurgery | Admitting: Neurosurgery

## 2021-01-24 ENCOUNTER — Encounter (HOSPITAL_COMMUNITY): Payer: Self-pay | Admitting: Neurosurgery

## 2021-01-24 ENCOUNTER — Encounter (HOSPITAL_COMMUNITY)
Admission: RE | Disposition: A | Discharge status: Home / Self Care | Payer: Self-pay | Source: Home / Self Care | Attending: Neurosurgery

## 2021-01-24 ENCOUNTER — Ambulatory Visit (HOSPITAL_COMMUNITY)
Admission: RE | Admit: 2021-01-24 | Discharge: 2021-01-24 | Disposition: A | Discharge status: Home / Self Care | Payer: BC Managed Care – PPO | Source: Ambulatory Visit | Attending: Neurosurgery | Admitting: Neurosurgery

## 2021-01-24 DIAGNOSIS — I770 Arteriovenous fistula, acquired: Secondary | ICD-10-CM | POA: Insufficient documentation

## 2021-01-24 DIAGNOSIS — R531 Weakness: Secondary | ICD-10-CM | POA: Insufficient documentation

## 2021-01-24 DIAGNOSIS — R202 Paresthesia of skin: Secondary | ICD-10-CM | POA: Diagnosis not present

## 2021-01-24 DIAGNOSIS — Q288 Other specified congenital malformations of circulatory system: Secondary | ICD-10-CM

## 2021-01-24 DIAGNOSIS — G8929 Other chronic pain: Secondary | ICD-10-CM | POA: Insufficient documentation

## 2021-01-24 DIAGNOSIS — M545 Low back pain, unspecified: Secondary | ICD-10-CM | POA: Insufficient documentation

## 2021-01-24 HISTORY — PX: IR ANGIO/SPINAL LEFT: IMG2270

## 2021-01-24 HISTORY — PX: IR ANGIO/SPINAL RIGHT: IMG2271

## 2021-01-24 HISTORY — DX: Essential (primary) hypertension: I10

## 2021-01-24 HISTORY — PX: RADIOLOGY WITH ANESTHESIA: SHX6223

## 2021-01-24 LAB — CBC
HCT: 47.5 % (ref 39.0–52.0)
Hemoglobin: 16 g/dL (ref 13.0–17.0)
MCH: 31.4 pg (ref 26.0–34.0)
MCHC: 33.7 g/dL (ref 30.0–36.0)
MCV: 93.3 fL (ref 80.0–100.0)
Platelets: 210 10*3/uL (ref 150–400)
RBC: 5.09 MIL/uL (ref 4.22–5.81)
RDW: 11.8 % (ref 11.5–15.5)
WBC: 6.2 10*3/uL (ref 4.0–10.5)
nRBC: 0 % (ref 0.0–0.2)

## 2021-01-24 LAB — BASIC METABOLIC PANEL
Anion gap: 8 (ref 5–15)
BUN: 17 mg/dL (ref 6–20)
CO2: 23 mmol/L (ref 22–32)
Calcium: 9.2 mg/dL (ref 8.9–10.3)
Chloride: 106 mmol/L (ref 98–111)
Creatinine, Ser: 1.02 mg/dL (ref 0.61–1.24)
GFR, Estimated: >60 mL/min (ref >60)
Glucose, Bld: 102 mg/dL — ABNORMAL HIGH (ref 70–99)
Potassium: 4 mmol/L (ref 3.5–5.1)
Sodium: 137 mmol/L (ref 135–145)

## 2021-01-24 IMAGING — XA IR ANGIO/ADRENAL/UNI*R*
11 of 24 series · 11 of 24 positions shown · IV contrast (IODINE)
Comparison: none

PROCEDURE:
DIAGNOSTIC SPINAL ANGIOGRAM
HISTORY: The patient is a 50-year-old man with a chronic history of low back
pain with more recent onset of lower extremity paresthesias and
urinary urgency with mild symptoms of retention. MRI demonstrated
multiple tortuous vessels surrounding the lower thoracic spinal cord
and the conus. Further MR angiogram suggested the possibility of
fistula versus spinal vascular malformation at approximately the
L3-L4 level. Therefore presents today for further workup with
diagnostic spinal angiogram.
TECHNIQUE: CATHETERS AND WIRES
5-EDMILA catheter

[Series 2: cerebral care 2 · 1 of 14 frames shown (1 of 11)]
[frame 3/14]
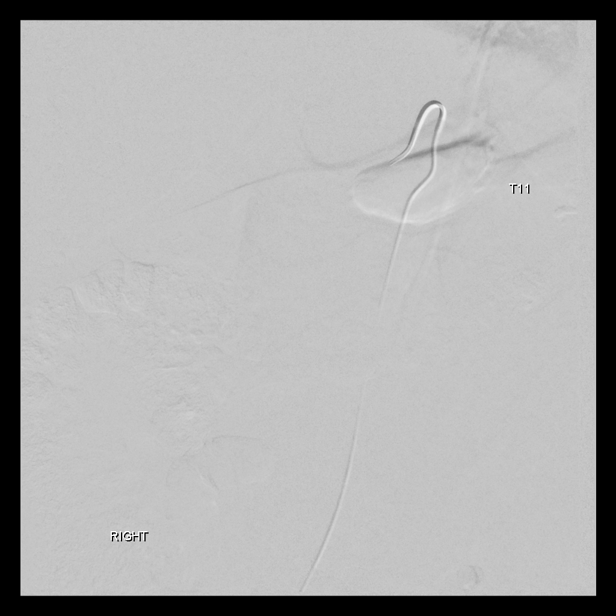

[Series 4: cerebral care 2 · 1 of 16 frames shown (2 of 11)]
[frame 3/16]
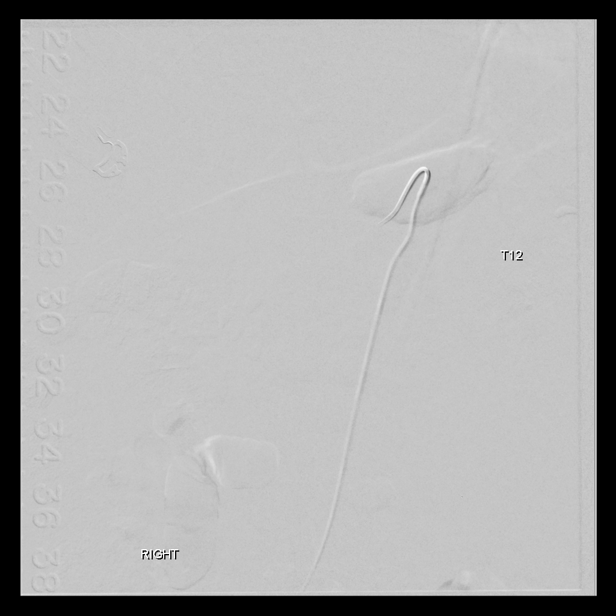

[Series 6: cerebral care 2 · 1 of 19 frames shown (3 of 11)]
[frame 3/19]
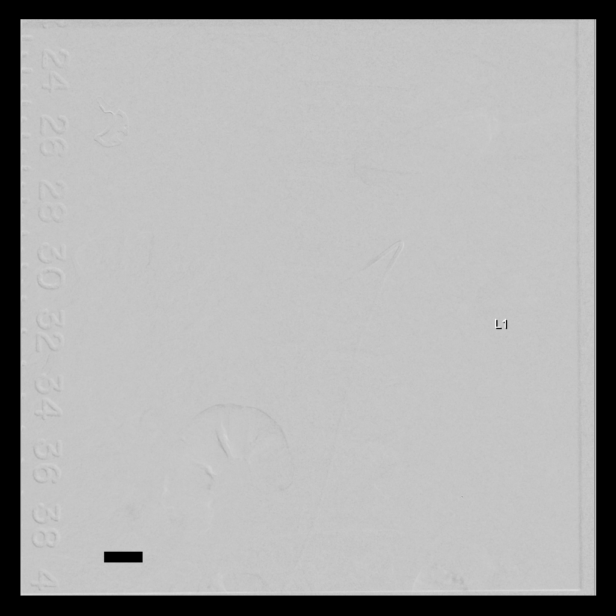

[Series 8: cerebral care 2 · 1 of 22 frames shown (4 of 11)]
[frame 12/22]
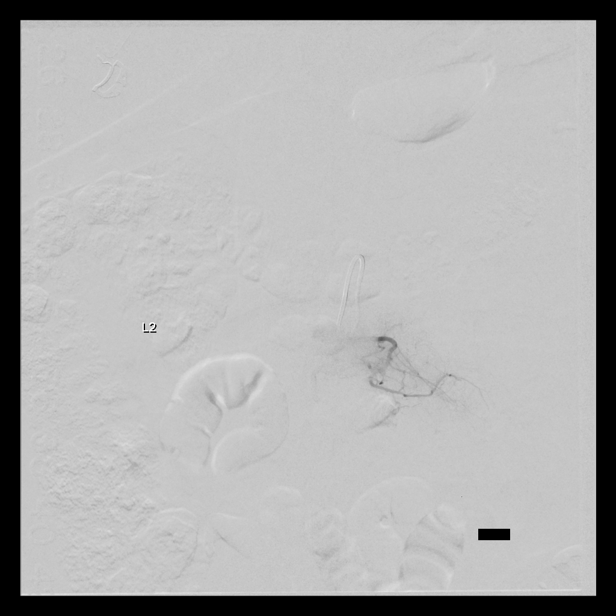

[Series 10: cerebral care 2 · 1 of 23 frames shown (5 of 11)]
[frame 12/23]
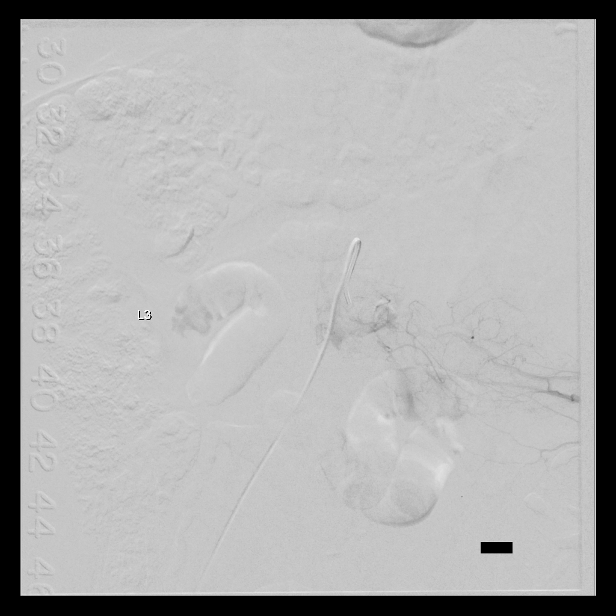

[Series 13: cerebral care 2 · 1 of 27 frames shown (6 of 11)]
[frame 14/27]
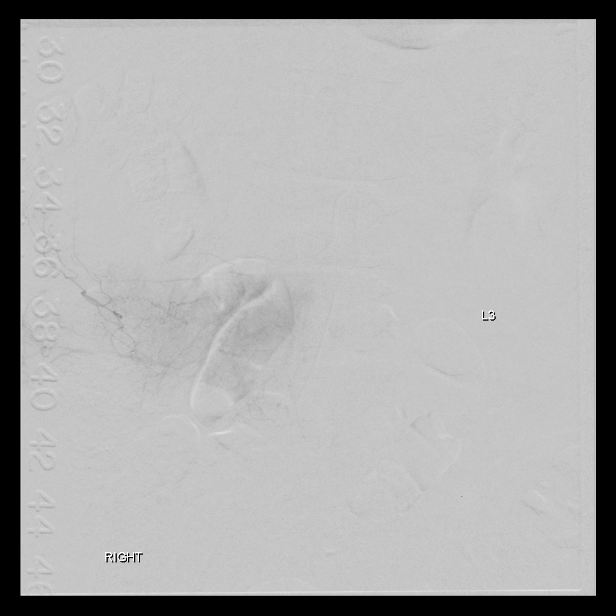

[Series 15: cerebral care 2 · 1 of 18 frames shown (7 of 11)]
[frame 10/18]
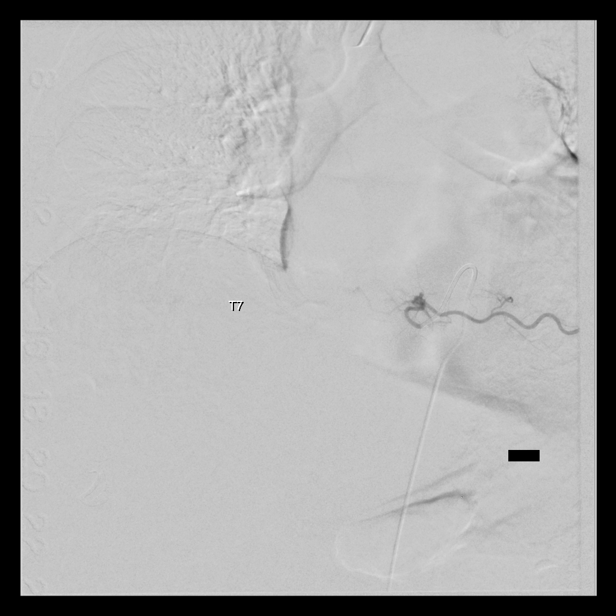

[Series 17: cerebral care 2 · 1 of 22 frames shown (8 of 11)]
[frame 12/22]
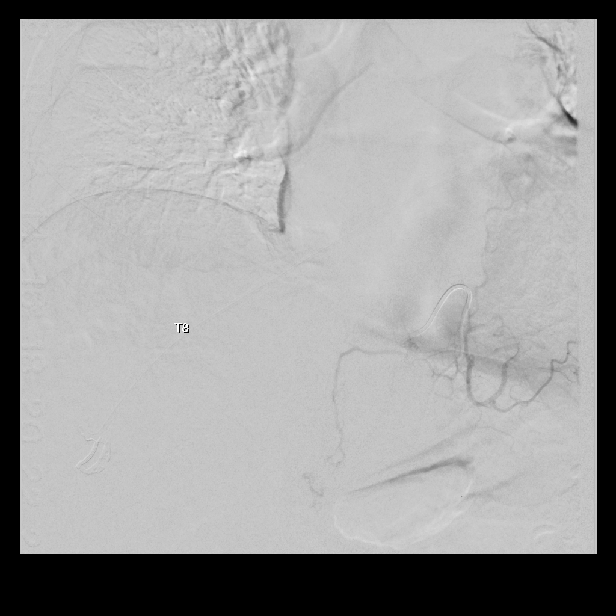

[Series 19: cerebral care 2 · 1 of 18 frames shown (9 of 11)]
[frame 16/18]
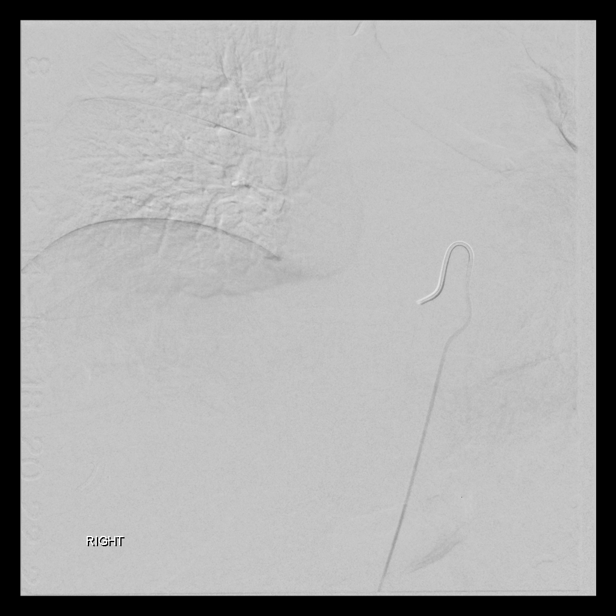

[Series 21: cerebral care 2 · 1 of 24 frames shown (10 of 11)]
[frame 21/24]
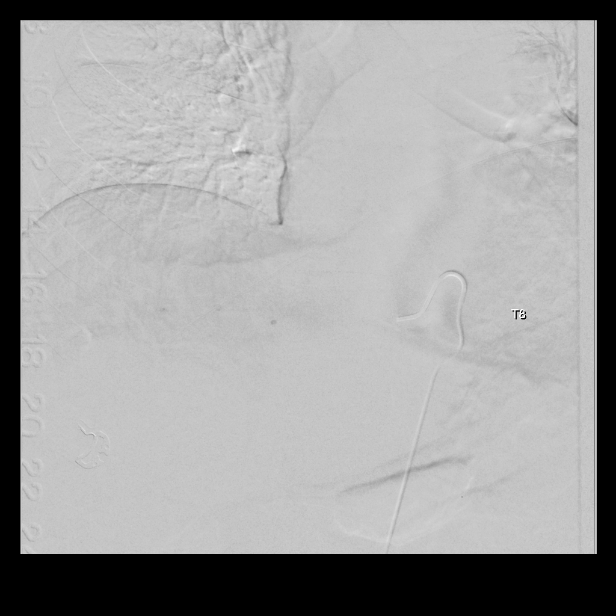

[Series 23: cerebral care 2 · 1 of 23 frames shown (11 of 11)]
[frame 23/23]
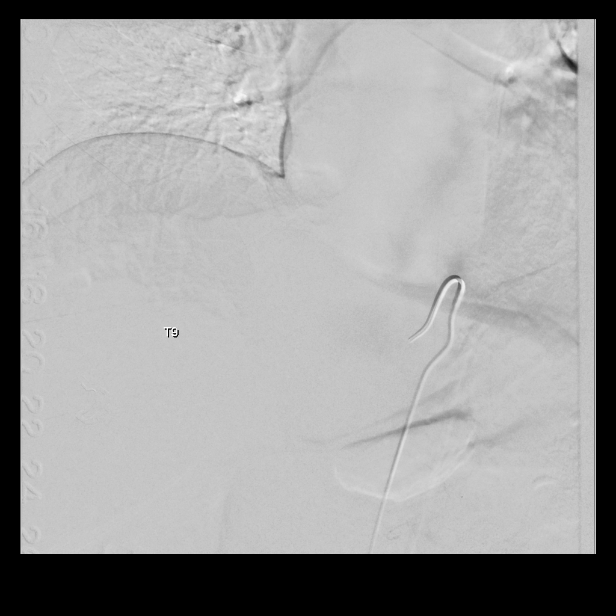

[11 of 24 positions shown; findings below may reference images not displayed]

ACCESS:
The technical aspects of the procedure as well as its potential
risks and benefits were reviewed with the patient. These risks
included but were not limited bleeding, infection, allergic
reaction, damage to organs or vital structures, stroke,
non-diagnostic procedure, and the catastrophic outcomes of heart
attack, coma, and death. With an understanding of these risks,
informed consent was obtained and witnessed. The patient was placed
in the supine position on the angiography table and the skin of
right groin prepped in the usual sterile fashion.

The procedure was performed under general anesthesia monitored by
the anesthesia service.

A 5- French sheath was introduced in the right common femoral artery
using Seldinger technique. A fluoro-phase sequence was used to
document the sheath position.

MEDICATIONS:
HEPARIN: [JI] Units total.

CONTRAST:  cc, Omnipaque 300

FLUOROSCOPY TIME:  FLUOROSCOPY TIME: See IR records
0.035" glidewire

5-EDMILA catheter

VESSELS CATHETERIZED
VESSELS CATHETERIZED:

Right / Left T6

Right / Left T7

Right / Left T8

Right / Left T9

Right / Left T10

Right / Left T11

Right / Left T12

Right / Left L1

Right / Left L2

Right / Left L3

Bilateral L4

Right common femoral

VESSELS STUDIED
Right / Left T6: AP

Right / Left T7: AP

Right / Left T8: AP

Right / Left T9: AP

Right / Left T10: AP

Right / Left T11: AP

Right / Left T12: AP

Right / Left L1: AP

Right / Left L2: AP

Right / Left L3: AP

Bilateral L4: AP

Right common femoral

PROCEDURAL NARRATIVE
The catheter was advanced over a 0.035 glidewire into the aortic
arch and the secondary curve was reformed. Ruler was used to
identify individual spinal levels. The above vessels were then
sequentially catheterized and angiograms taken. After review of
images, the catheter was removed without incident.
FINDINGS: Right T6:

Normal segmental vessel. No contribution to the anterior spinal
artery is seen.

Left T6:

Normal segmental vessel. There is significant contribution to the
posterior spinal artery at this level. There is an inferiorly
directed vessel apart from the radical of unclear significance.
These do not have the appearance of perimedullary veins.

Right T7:

Normal segmental vessel. No contribution to the anterior spinal
artery is seen

Left T7:

Normal segmental vessel. No contribution to the anterior spinal
artery is seen

Right T8:

Normal segmental vessel. No contribution to the anterior spinal
artery is seen.

Left T8:

Normal segmental vessel. No contribution to the anterior spinal
artery is seen. There is an inferiorly directed vessel of unclear
significance however this does not have the appearance of a typical
perimedullary vein.

Right T9:

Normal segmental vessel. No contribution to the anterior spinal
artery is seen.

Left T9:

Normal segmental vessel. No contribution to the anterior spinal
artery is seen.

Right T10:

Normal segmental vessel. No contribution to the anterior spinal
artery is seen.

Left T10:

Normal segmental vessel. Significant contribution to the anterior
spinal artery is seen at this level.

Right T11:

Normal segmental vessel. No contribution to the anterior spinal
artery is seen.

Left T11:

Normal segmental vessel. No contribution to the anterior spinal
artery is seen

Right T12:

Normal segmental vessel. No contribution to the anterior spinal
artery is seen

Left T12:

Normal segmental vessel. No contribution to the anterior spinal
artery is seen

Right L1:

Normal segmental vessel. No contribution to the anterior spinal
artery is seen

Left L1:

Normal segmental vessel. No contribution to the anterior spinal
artery is seen

Right L2:

Normal segmental vessel. No contribution to the anterior spinal
artery is seen

Left L2:

Normal segmental vessel. No contribution to the anterior spinal
artery is seen

Right L3:

Normal segmental vessel. No contribution to the anterior spinal
artery is seen

Left L3:

Normal segmental vessel. There is a small vascular blush possibly
representing a venous varix just medial to the left L3 pedicle.
There is early opacification of multiple tortuous perimedullary
veins draining in a cephalad direction corresponding to the findings
on preoperative MRI and MRA.

Bilateral L4:

Bilateral L4 segmental vessels arise from a common trunk. No
contribution to the anterior spinal artery is seen

Right femoral:

Normal vessel. No significant atherosclerotic disease. Arterial
sheath in adequate position.

DISPOSITION:
Upon completion of the study, the femoral sheath was removed and
hemostasis obtained using a 5-Fr ExoSeal closure device. Good
proximal and distal lower extremity pulses were documented upon
achievement of hemostasis. The procedure was well tolerated and no
early complications were observed. The patient was transferred to
the holding area to lay flat for 2 hours.
IMPRESSION: 1. Findings consistent with spinal dural AV fistula arising from the
left L3 segmental vessel as described above.

The preliminary results of this procedure were shared with the
patient and the patient's family.

## 2021-01-24 SURGERY — IR WITH ANESTHESIA
Anesthesia: General

## 2021-01-24 MED ORDER — OXYCODONE HCL 5 MG/5ML PO SOLN: 5.0000 mg | Freq: Once | ORAL | Status: DC | PRN

## 2021-01-24 MED ORDER — PHENYLEPHRINE 40 MCG/ML (10ML) SYRINGE FOR IV PUSH (FOR BLOOD PRESSURE SUPPORT)
PREFILLED_SYRINGE | INTRAVENOUS | Status: DC | PRN
  Administered 2021-01-24 (×3): 80 ug via INTRAVENOUS

## 2021-01-24 MED ORDER — PROPOFOL 10 MG/ML IV BOLUS
INTRAVENOUS | Status: AC
  Filled 2021-01-24: qty 20

## 2021-01-24 MED ORDER — ONDANSETRON HCL 4 MG/2ML IJ SOLN
INTRAMUSCULAR | Status: AC
  Filled 2021-01-24: qty 2

## 2021-01-24 MED ORDER — FENTANYL CITRATE (PF) 250 MCG/5ML IJ SOLN
INTRAMUSCULAR | Status: AC
  Filled 2021-01-24: qty 5

## 2021-01-24 MED ORDER — ONDANSETRON HCL 4 MG/2ML IJ SOLN
INTRAMUSCULAR | Status: DC | PRN
  Administered 2021-01-24: 4 mg via INTRAVENOUS

## 2021-01-24 MED ORDER — IOHEXOL 300 MG/ML  SOLN
100.0000 mL | Freq: Once | INTRAMUSCULAR | Status: AC | PRN
  Administered 2021-01-24: 11:00:00 70 mL via INTRA_ARTERIAL

## 2021-01-24 MED ORDER — HYDROCODONE-ACETAMINOPHEN 5-325 MG PO TABS: 1.0000 | ORAL_TABLET | ORAL | Status: DC | PRN

## 2021-01-24 MED ORDER — CHLORHEXIDINE GLUCONATE 0.12 % MT SOLN: 15.0000 mL | Freq: Once | OROMUCOSAL | Status: AC

## 2021-01-24 MED ORDER — EPHEDRINE 5 MG/ML INJ
INTRAVENOUS | Status: AC
  Filled 2021-01-24: qty 5

## 2021-01-24 MED ORDER — LACTATED RINGERS IV SOLN: INTRAVENOUS | Status: DC

## 2021-01-24 MED ORDER — ROCURONIUM BROMIDE 10 MG/ML (PF) SYRINGE
PREFILLED_SYRINGE | INTRAVENOUS | Status: AC
  Filled 2021-01-24: qty 10

## 2021-01-24 MED ORDER — ORAL CARE MOUTH RINSE: 15.0000 mL | Freq: Once | OROMUCOSAL | Status: AC

## 2021-01-24 MED ORDER — ONDANSETRON HCL 4 MG/2ML IJ SOLN: 4.0000 mg | Freq: Once | INTRAMUSCULAR | Status: DC | PRN

## 2021-01-24 MED ORDER — DEXAMETHASONE SODIUM PHOSPHATE 10 MG/ML IJ SOLN
INTRAMUSCULAR | Status: AC
  Filled 2021-01-24: qty 1

## 2021-01-24 MED ORDER — ROCURONIUM BROMIDE 10 MG/ML (PF) SYRINGE
PREFILLED_SYRINGE | INTRAVENOUS | Status: DC | PRN
  Administered 2021-01-24: 30 mg via INTRAVENOUS
  Administered 2021-01-24: 70 mg via INTRAVENOUS

## 2021-01-24 MED ORDER — GLUCAGON HCL RDNA (DIAGNOSTIC) 1 MG IJ SOLR
INTRAMUSCULAR | Status: AC
  Filled 2021-01-24: qty 1

## 2021-01-24 MED ORDER — DICLOFENAC SODIUM 50 MG PO TBEC: 50.0000 mg | DELAYED_RELEASE_TABLET | Freq: Two times a day (BID) | ORAL | 1 refills | Status: DC

## 2021-01-24 MED ORDER — FENTANYL CITRATE (PF) 250 MCG/5ML IJ SOLN
INTRAMUSCULAR | Status: DC | PRN
  Administered 2021-01-24: 150 ug via INTRAVENOUS

## 2021-01-24 MED ORDER — PROPOFOL 10 MG/ML IV BOLUS
INTRAVENOUS | Status: DC | PRN
  Administered 2021-01-24: 50 mg via INTRAVENOUS
  Administered 2021-01-24: 200 mg via INTRAVENOUS

## 2021-01-24 MED ORDER — MIDAZOLAM HCL 2 MG/2ML IJ SOLN
INTRAMUSCULAR | Status: AC
  Filled 2021-01-24: qty 2

## 2021-01-24 MED ORDER — HEPARIN SODIUM (PORCINE) 1000 UNIT/ML IJ SOLN
INTRAMUSCULAR | Status: DC | PRN
  Administered 2021-01-24: 2000 [IU] via INTRAVENOUS

## 2021-01-24 MED ORDER — SUGAMMADEX SODIUM 200 MG/2ML IV SOLN
INTRAVENOUS | Status: DC | PRN
  Administered 2021-01-24: 200 mg via INTRAVENOUS

## 2021-01-24 MED ORDER — EPHEDRINE SULFATE-NACL 50-0.9 MG/10ML-% IV SOSY
PREFILLED_SYRINGE | INTRAVENOUS | Status: DC | PRN
  Administered 2021-01-24 (×3): 5 mg via INTRAVENOUS

## 2021-01-24 MED ORDER — SODIUM CHLORIDE 0.9 % IV SOLN: INTRAVENOUS | Status: DC

## 2021-01-24 MED ORDER — CHLORHEXIDINE GLUCONATE 0.12 % MT SOLN
OROMUCOSAL | Status: AC
  Administered 2021-01-24: 07:00:00 15 mL via OROMUCOSAL
  Filled 2021-01-24: qty 15

## 2021-01-24 MED ORDER — MIDAZOLAM HCL 2 MG/2ML IJ SOLN
INTRAMUSCULAR | Status: DC | PRN
  Administered 2021-01-24: 2 mg via INTRAVENOUS

## 2021-01-24 MED ORDER — LIDOCAINE 2% (20 MG/ML) 5 ML SYRINGE
INTRAMUSCULAR | Status: DC | PRN
  Administered 2021-01-24: 100 mg via INTRAVENOUS

## 2021-01-24 MED ORDER — LIDOCAINE 2% (20 MG/ML) 5 ML SYRINGE
INTRAMUSCULAR | Status: AC
  Filled 2021-01-24: qty 5

## 2021-01-24 MED ORDER — FENTANYL CITRATE (PF) 100 MCG/2ML IJ SOLN: 25.0000 ug | INTRAMUSCULAR | Status: DC | PRN

## 2021-01-24 MED ORDER — AMISULPRIDE (ANTIEMETIC) 5 MG/2ML IV SOLN: 10.0000 mg | Freq: Once | INTRAVENOUS | Status: DC | PRN

## 2021-01-24 MED ORDER — PHENYLEPHRINE 40 MCG/ML (10ML) SYRINGE FOR IV PUSH (FOR BLOOD PRESSURE SUPPORT)
PREFILLED_SYRINGE | INTRAVENOUS | Status: AC
  Filled 2021-01-24: qty 10

## 2021-01-24 MED ORDER — ACETAMINOPHEN 10 MG/ML IV SOLN: 1000.0000 mg | Freq: Once | INTRAVENOUS | Status: DC | PRN

## 2021-01-24 MED ORDER — OXYCODONE HCL 5 MG PO TABS: 5.0000 mg | ORAL_TABLET | Freq: Once | ORAL | Status: DC | PRN

## 2021-01-24 MED ORDER — PHENYLEPHRINE HCL-NACL 20-0.9 MG/250ML-% IV SOLN
INTRAVENOUS | Status: DC | PRN
  Administered 2021-01-24: 25 ug/min via INTRAVENOUS

## 2021-01-24 MED ORDER — DEXAMETHASONE SODIUM PHOSPHATE 10 MG/ML IJ SOLN
INTRAMUSCULAR | Status: DC | PRN
  Administered 2021-01-24: 10 mg via INTRAVENOUS

## 2021-01-24 NOTE — Progress Notes (Signed)
Up and walked and tolerated well; right groin stable, no bleeding or hematoma 

## 2021-01-24 NOTE — Brief Op Note (Signed)
°  NEUROSURGERY BRIEF OPERATIVE  NOTE   PREOP DX: Spinal vascular malformation  POSTOP DX: Same  PROCEDURE: Diagnostic spinal angiogram  SURGEON: Dr. Lisbeth Renshaw, MD  ANESTHESIA: GETA  EBL: Minimal  SPECIMENS: None  COMPLICATIONS: None  CONDITION: Stable to recovery  FINDINGS (Full report in [facility]): 1. 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

## 2021-01-24 NOTE — Sedation Documentation (Signed)
5 Fr. Exoseal deployed on right groin site. Holding pressure at this time.

## 2021-01-24 NOTE — Anesthesia Procedure Notes (Signed)
Procedure Name: Intubation Date/Time: 01/24/2021 9:04 AM Performed by: Carolan Clines, CRNA Pre-anesthesia Checklist: Patient identified, Emergency Drugs available, Suction available and Patient being monitored Patient Re-evaluated:Patient Re-evaluated prior to induction Oxygen Delivery Method: Circle System Utilized Preoxygenation: Pre-oxygenation with 100% oxygen Induction Type: IV induction Ventilation: Mask ventilation without difficulty and Oral airway inserted - appropriate to patient size Laryngoscope Size: Mac and 4 Grade View: Grade II Tube type: Oral Tube size: 7.5 mm Number of attempts: 1 Airway Equipment and Method: Stylet and Oral airway Placement Confirmation: ETT inserted through vocal cords under direct vision, positive ETCO2 and breath sounds checked- equal and bilateral Secured at: 23 cm Tube secured with: Tape Dental Injury: Teeth and Oropharynx as per pre-operative assessment

## 2021-01-24 NOTE — Discharge Summary (Addendum)
°  Physician Discharge Summary  Patient ID: Sean Mcdaniel MRN: [id number] DOB/AGE: [birth year] 49 y.o.  Admit date: 01/24/2021 Discharge date: 01/24/2021  Admission Diagnoses:  Spinal Fistula  Discharge Diagnoses:  Same Active Problems:   * No active hospital problems. *   Discharged Condition: Stable  Hospital Course:  Sean Mcdaniel is a 50 y.o. male who underwent elective spinal angiogram under anesthesia. He was at baseline postoperatively and was discharged home after routine bedrest period.  Treatments: Surgery - Spinal Angiogram  Discharge Exam: Blood pressure 134/74, pulse 65, temperature (!) 97.2 F (36.2 C), resp. rate 19, height 5\' 9"  (1.753 m), weight 111.1 kg, SpO2 98 %. Awake, alert, oriented Speech fluent, appropriate CN grossly intact 5/5 BUE/BLE Wound c/d/i  Disposition: Discharge disposition: 01-Home or Self Care       Discharge Instructions     Call MD for:  redness, tenderness, or signs of infection (pain, swelling, redness, odor or green/yellow discharge around incision site)   Complete by: As directed    Call MD for:  temperature >100.4   Complete by: As directed    Diet - low sodium heart healthy   Complete by: As directed    Discharge instructions   Complete by: As directed    Walk at home as much as possible, at least 4 times / day   Increase activity slowly   Complete by: As directed    Lifting restrictions   Complete by: As directed    No lifting > 10 lbs   May shower / Bathe   Complete by: As directed    48 hours after surgery   May walk up steps   Complete by: As directed    No dressing needed   Complete by: As directed    Other Restrictions   Complete by: As directed    No bending/twisting at waist      Allergies as of 01/24/2021   No Known Allergies      Medication List     TAKE these medications    diclofenac 50 MG EC tablet Commonly known as: VOLTAREN Take 1 tablet (50 mg total) by mouth 2 (two) times daily.    ibuprofen 200 MG tablet Commonly known as: ADVIL Take 800-1,200 mg by mouth every 6 (six) hours as needed for mild pain.   multivitamin capsule Take 1 capsule by mouth daily.               Discharge Care Instructions  (From admission, onward)           Start     Ordered   01/24/21 0000  No dressing needed        01/24/21 1051             Signed: 01/26/21 01/24/2021, 11:38 AM

## 2021-01-24 NOTE — Anesthesia Postprocedure Evaluation (Signed)
Anesthesia Post Note  Patient: Sean Mcdaniel  Procedure(s) Performed: Spinal Angiogram     Patient location during evaluation: PACU Anesthesia Type: General Level of consciousness: awake and alert Pain management: pain level controlled Vital Signs Assessment: post-procedure vital signs reviewed and stable Respiratory status: spontaneous breathing, nonlabored ventilation, respiratory function stable and patient connected to nasal cannula oxygen Cardiovascular status: blood pressure returned to baseline and stable Postop Assessment: no apparent nausea or vomiting Anesthetic complications: no   No notable events documented.  Last Vitals:  Vitals:   01/24/21 1105 01/24/21 1120  BP: 134/71 134/74  Pulse: 72 65  Resp: 16 19  Temp:  (!) 36.2 C  SpO2: 95% 98%    Last Pain:  Vitals:   01/24/21 1120  TempSrc:   PainSc: 0-No pain                 Trevor Iha

## 2021-01-24 NOTE — H&P (Signed)
°  Chief Complaint  Back pain, leg weakness  History of Present Illness  Shloimy Michalski is a 50 y.o. male initially seen in the outpatient neurosurgery clinic with a chronic history of low back pain but over the last several months complaining of progressively worsening paresthesias involving both his legs and feet as well as some changes in bladder function.  He notes initially left-sided leg and foot numbness and tingling which is progressed to now involve both legs and feet.  Symptoms wax and wane.  In addition, he notes symptoms of urinary urgency and retention.  He denies any upper extremity weakness, incoordination, or paresthesias.  Past Medical History   Past Medical History:  Diagnosis Date   Hypertension     Past Surgical History   Past Surgical History:  Procedure Laterality Date   NO PAST SURGERIES      Social History   Social History   Tobacco Use   Smoking status: Never   Smokeless tobacco: Never  Vaping Use   Vaping Use: Never used  Substance Use Topics   Alcohol use: Yes    Comment: occ   Drug use: Never    Medications   Prior to Admission medications   Medication Sig Start Date End Date Taking? Authorizing Provider  ibuprofen (ADVIL) 200 MG tablet Take 800-1,200 mg by mouth every 6 (six) hours as needed for mild pain.   Yes [provider]  Multiple Vitamin (MULTIVITAMIN) capsule Take 1 capsule by mouth daily.   Yes [provider]    Allergies  No Known Allergies  Review of Systems  ROS  Neurologic Exam  Awake, alert, oriented Memory and concentration grossly intact Speech fluent, appropriate CN grossly intact Motor exam: Upper Extremities Deltoid Bicep Tricep Grip  Right 5/5 5/5 5/5 5/5  Left 5/5 5/5 5/5 5/5   Lower Extremities IP Quad PF DF EHL  Right 5/5 5/5 5/5 5/5 5/5  Left 5/5 5/5 5/5 5/5 5/5   Sensation grossly intact to LT  Imaging  MRA of the thoracic and lumbar spine was personally reviewed and does  demonstrate the likelihood of a spinal dural fistula on the left centered approximately L3-4.  Impression  - 50 y.o. 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  We have discussed the associated risks, benefits, and alternatives.  We have also reviewed the expected postoperative course and recovery.  All his questions today were answered.  He provided informed consent to proceed.   Lisbeth Renshaw, MD [facility]

## 2021-01-24 NOTE — Sedation Documentation (Signed)
Pt transported to PACU bay 6 via stretcher accompanied by RN and CRNA. JJ RN at bedside to receive pt. Handoff complete. Right groin dressing clean dry and intact. Bilateral lower distal pulses palpable. Pt drowsy but easily arousable and responds appropriately. No s/s of distress at this time.

## 2021-01-24 NOTE — Transfer of Care (Signed)
Immediate Anesthesia Transfer of Care Note  Patient: Sean Mcdaniel  Procedure(s) Performed: Spinal Angiogram  Patient Location: PACU  Anesthesia Type:General  Level of Consciousness: awake, alert  and oriented  Airway & Oxygen Therapy: Patient Spontanous Breathing  Post-op Assessment: Report given to RN and Post -op Vital signs reviewed and stable  Post vital signs: Reviewed and stable  Last Vitals:  Vitals Value Taken Time  BP 116/62 01/24/21 1050  Temp    Pulse 78 01/24/21 1052  Resp 57 01/24/21 1052  SpO2 93 % 01/24/21 1052  Vitals shown include unvalidated device data.  Last Pain:  Vitals:   01/24/21 0634  TempSrc:   PainSc: 7       Patients Stated Pain Goal: 4 (01/24/21 5956)  Complications: No notable events documented.

## 2021-01-27 ENCOUNTER — Encounter (HOSPITAL_COMMUNITY): Payer: Self-pay | Admitting: Neurosurgery

## 2021-01-28 ENCOUNTER — Encounter (HOSPITAL_COMMUNITY): Payer: Self-pay

## 2021-02-07 ENCOUNTER — Other Ambulatory Visit: Payer: Self-pay | Admitting: Neurosurgery

## 2021-02-17 ENCOUNTER — Other Ambulatory Visit: Payer: Self-pay | Admitting: Neurosurgery

## 2021-02-24 NOTE — Progress Notes (Signed)
Surgical Instructions    Your procedure is scheduled on Friday January 27th.  Report to [facility] Main Entrance "A" at 9:40 A.M., then check in with the Admitting office.  Call this number if you have problems the morning of surgery:  [phone]   If you have any questions prior to your surgery date call [phone]: Open Monday-Friday 8am-4pm    Remember:  Do not eat or drink anything after midnight the night before your surgery     Take these medicines the morning of surgery with A SIP OF WATER NONE    As of today, STOP taking any Aspirin (unless otherwise instructed by your surgeon) Voltaren, Aleve, Naproxen, Ibuprofen, Motrin, Advil, Goody's, BC's, all herbal medications, fish oil, and all vitamins.  After your COVID test   You are not required to quarantine however you are required to wear a well-fitting mask when you are out and around people not in your household.  If your mask becomes wet or soiled, replace with a new one.  Wash your hands often with soap and water for 20 seconds or clean your hands with an alcohol-based hand sanitizer that contains at least 60% alcohol.  Do not share personal items.  Notify your provider: if you are in close contact with someone who has COVID  or if you develop a fever of 100.4 or greater, sneezing, cough, sore throat, shortness of breath or body aches.           Do not wear jewelry  Do not wear lotions, powders, colognes, or deodorant. Do not shave 48 hours prior to surgery.  Men may shave face and neck. Do not bring valuables to the hospital. DO Not wear nail polish, gel polish, artificial nails, or any other type of covering on natural nails (fingers and toes) If you have artificial nails or gel coating that need to be removed by a nail salon, please have this removed prior to surgery. Artificial nails or gel coating may interfere with anesthesia's ability to adequately monitor your vital signs.             [facility] is not  responsible for any belongings or valuables.  Do NOT Smoke (Tobacco/Vaping)  24 hours prior to your procedure  If you use a CPAP at night, you may bring your mask for your overnight stay.   Contacts, glasses, hearing aids, dentures or partials may not be worn into surgery, please bring cases for these belongings   For patients admitted to the hospital, discharge time will be determined by your treatment team.   Patients discharged the day of surgery will not be allowed to drive home, and someone needs to stay with them for 24 hours.  NO VISITORS WILL BE ALLOWED IN PRE-OP WHERE PATIENTS ARE PREPPED FOR SURGERY.  ONLY 1 SUPPORT PERSON MAY BE PRESENT IN THE WAITING ROOM WHILE YOU ARE IN SURGERY.  IF YOU ARE TO BE ADMITTED, ONCE YOU ARE IN YOUR ROOM YOU WILL BE ALLOWED TWO (2) VISITORS. 1 (ONE) VISITOR MAY STAY OVERNIGHT BUT MUST ARRIVE TO THE ROOM BY 8pm.  Minor children may have two parents present. Special consideration for safety and communication needs will be reviewed on a case by case basis.  Special instructions:    Oral Hygiene is also important to reduce your risk of infection.  Remember - BRUSH YOUR TEETH THE MORNING OF SURGERY WITH YOUR REGULAR TOOTHPASTE   [facility]- Preparing For Surgery  Before surgery, you can play an important  role. Because skin is not sterile, your skin needs to be as free of germs as possible. You can reduce the number of germs on your skin by washing with CHG (chlorahexidine gluconate) Soap before surgery.  CHG is an antiseptic cleaner which kills germs and bonds with the skin to continue killing germs even after washing.     Please do not use if you have an allergy to CHG or antibacterial soaps. If your skin becomes reddened/irritated stop using the CHG.  Do not shave (including legs and underarms) for at least 48 hours prior to first CHG shower. It is OK to shave your face.  Please follow these instructions carefully.     Shower the NIGHT BEFORE  SURGERY and the MORNING OF SURGERY with CHG Soap.   If you chose to wash your hair, wash your hair first as usual with your normal shampoo. After you shampoo, rinse your hair and body thoroughly to remove the shampoo.  Then [employer] and genitals (private parts) with your normal soap and rinse thoroughly to remove soap.  After that Use CHG Soap as you would any other liquid soap. You can apply CHG directly to the skin and wash gently with a scrungie or a clean washcloth.   Apply the CHG Soap to your body ONLY FROM THE NECK DOWN.  Do not use on open wounds or open sores. Avoid contact with your eyes, ears, mouth and genitals (private parts). Wash Face and genitals (private parts)  with your normal soap.   Wash thoroughly, paying special attention to the area where your surgery will be performed.  Thoroughly rinse your body with warm water from the neck down.  DO NOT shower/wash with your normal soap after using and rinsing off the CHG Soap.  Pat yourself dry with a CLEAN TOWEL.  Wear CLEAN PAJAMAS to bed the night before surgery  Place CLEAN SHEETS on your bed the night before your surgery  DO NOT SLEEP WITH PETS.   Day of Surgery:  Take a shower with CHG soap. Wear Clean/Comfortable clothing the morning of surgery Do not apply any deodorants/lotions.   Remember to brush your teeth WITH YOUR REGULAR TOOTHPASTE.   Please read over the following fact sheets that you were given.

## 2021-02-25 ENCOUNTER — Encounter (HOSPITAL_COMMUNITY)
Admission: RE | Admit: 2021-02-25 | Discharge: 2021-02-25 | Disposition: A | Discharge status: Home / Self Care | Payer: BC Managed Care – PPO | Source: Ambulatory Visit | Attending: Neurosurgery | Admitting: Neurosurgery

## 2021-02-25 ENCOUNTER — Other Ambulatory Visit: Payer: Self-pay

## 2021-02-25 ENCOUNTER — Encounter (HOSPITAL_COMMUNITY): Payer: Self-pay

## 2021-02-25 VITALS — BP 207/114 | HR 66 | Temp 97.7°F | Resp 19 | Ht 69.0 in | Wt 247.0 lb

## 2021-02-25 DIAGNOSIS — I10 Essential (primary) hypertension: Secondary | ICD-10-CM

## 2021-02-25 DIAGNOSIS — M79604 Pain in right leg: Secondary | ICD-10-CM | POA: Insufficient documentation

## 2021-02-25 DIAGNOSIS — M48061 Spinal stenosis, lumbar region without neurogenic claudication: Secondary | ICD-10-CM | POA: Insufficient documentation

## 2021-02-25 DIAGNOSIS — R531 Weakness: Secondary | ICD-10-CM | POA: Insufficient documentation

## 2021-02-25 DIAGNOSIS — Z20822 Contact with and (suspected) exposure to covid-19: Secondary | ICD-10-CM | POA: Insufficient documentation

## 2021-02-25 DIAGNOSIS — M549 Dorsalgia, unspecified: Secondary | ICD-10-CM | POA: Insufficient documentation

## 2021-02-25 DIAGNOSIS — I4510 Unspecified right bundle-branch block: Secondary | ICD-10-CM | POA: Insufficient documentation

## 2021-02-25 DIAGNOSIS — Z791 Long term (current) use of non-steroidal anti-inflammatories (NSAID): Secondary | ICD-10-CM | POA: Insufficient documentation

## 2021-02-25 DIAGNOSIS — Q7649 Other congenital malformations of spine, not associated with scoliosis: Secondary | ICD-10-CM | POA: Insufficient documentation

## 2021-02-25 DIAGNOSIS — M79605 Pain in left leg: Secondary | ICD-10-CM | POA: Insufficient documentation

## 2021-02-25 DIAGNOSIS — Z01812 Encounter for preprocedural laboratory examination: Secondary | ICD-10-CM | POA: Insufficient documentation

## 2021-02-25 DIAGNOSIS — Z01818 Encounter for other preprocedural examination: Secondary | ICD-10-CM

## 2021-02-25 LAB — CBC
HCT: 45.1 % (ref 39.0–52.0)
Hemoglobin: 15 g/dL (ref 13.0–17.0)
MCH: 30.9 pg (ref 26.0–34.0)
MCHC: 33.3 g/dL (ref 30.0–36.0)
MCV: 92.8 fL (ref 80.0–100.0)
Platelets: 196 10*3/uL (ref 150–400)
RBC: 4.86 MIL/uL (ref 4.22–5.81)
RDW: 11.9 % (ref 11.5–15.5)
WBC: 6.2 10*3/uL (ref 4.0–10.5)
nRBC: 0 % (ref 0.0–0.2)

## 2021-02-25 LAB — COMPREHENSIVE METABOLIC PANEL
ALT: 52 U/L — ABNORMAL HIGH (ref 0–44)
AST: 36 U/L (ref 15–41)
Albumin: 4.3 g/dL (ref 3.5–5.0)
Alkaline Phosphatase: 79 U/L (ref 38–126)
Anion gap: 7 (ref 5–15)
BUN: 17 mg/dL (ref 6–20)
CO2: 29 mmol/L (ref 22–32)
Calcium: 9.4 mg/dL (ref 8.9–10.3)
Chloride: 103 mmol/L (ref 98–111)
Creatinine, Ser: 1.21 mg/dL (ref 0.61–1.24)
GFR, Estimated: >60 mL/min (ref >60)
Glucose, Bld: 110 mg/dL — ABNORMAL HIGH (ref 70–99)
Potassium: 4.4 mmol/L (ref 3.5–5.1)
Sodium: 139 mmol/L (ref 135–145)
Total Bilirubin: 0.8 mg/dL (ref 0.3–1.2)
Total Protein: 7.2 g/dL (ref 6.5–8.1)

## 2021-02-25 LAB — SURGICAL PCR SCREEN
MRSA, PCR: NEGATIVE
Staphylococcus aureus: NEGATIVE

## 2021-02-25 LAB — SARS CORONAVIRUS 2 (TAT 6-24 HRS): SARS Coronavirus 2: NEGATIVE

## 2021-02-25 NOTE — Progress Notes (Addendum)
PCP - None Cardiologist - None  Chest x-ray - Not indicated EKG - 01/24/21  COVID TEST- 02/25/21   Anesthesia review: yes B/P elevated, checked manual and again at the end of the appt.   Fayrene Fearing, PA called to speak with patient.   Patient denies shortness of breath, fever, cough and chest pain at PAT appointment   All instructions explained to the patient, with a verbal understanding of the material. Patient agrees to go over the instructions while at home for a better understanding. Patient also instructed to wear a mask while in public after being tested for COVID-19. The opportunity to ask questions was provided.

## 2021-02-26 NOTE — Anesthesia Preprocedure Evaluation (Deleted)
Anesthesia Evaluation    Airway        Dental   Pulmonary           Cardiovascular hypertension,      Neuro/Psych    GI/Hepatic   Endo/Other    Renal/GU      Musculoskeletal   Abdominal   Peds  Hematology   Anesthesia Other Findings   Reproductive/Obstetrics                             Anesthesia Physical Anesthesia Plan  ASA:   Anesthesia Plan:    Post-op Pain Management:    Induction:   PONV Risk Score and Plan:   Airway Management Planned:   Additional Equipment:   Intra-op Plan:   Post-operative Plan:   Informed Consent:   Plan Discussed with:   Anesthesia Plan Comments: (See PAT note by Ellary Casamento Burns, PA-C )        Anesthesia Quick Evaluation  

## 2021-02-26 NOTE — Progress Notes (Signed)
Anesthesia Chart Review:  Patient noted to have markedly elevated blood pressure preop testing appointment.  192/110 on arrival, 207/114 on recheck.  He denies any symptoms of pain, shortness of breath, headache.  He states he does not have a history of hypertension, however he knows it has been running high for several months.  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  He is in severe pain with any ambulation.  He states the walk from the front of the hospital to the preop clinic was difficult secondary to pain.  He is an [employer] and his baseline activity level prior to worsening of his lumbar stenosis was reasonably high.  He stated he could perform relatively heavy work without any symptoms of shortness of breath or chest discomfort.  Currently, he is not able to do much physical activity at all secondary to pain and weakness.  He states his blood pressure tends to improve when he is able to lay down as this does relieve some of the pain.  Also reported taking 6-8 ibuprofen tablets 3 times a day due to the pain.  He was advised to reduce this by half leading up to surgery as this is also likely contributing to his hypertension. He understands that markedly elevated blood pressure could be cause for cancellation on day of surgery, however it does seem clear that there is an element of pain also exacerbating his hypertension and that this surgery may be part of the treatment.  On exam, he is well-appearing, in NAD, heart RRR no MRG, lungs CTAB.  EKG 01/24/2021 showed NSR.  Rate 86.  Right bundle branch block.  He recently had general anesthesia for a spinal angiogram on 01/24/2021.  Records from this were reviewed.  His blood pressure on arrival was similarly elevated 206/126, next reading was 174/89, and subsequent readings were normotensive.  Patient not diagnosed with OSA but  STOP-BANG score was 5.  Discussed this with patient and the potential for untreated OSA contributing to his elevated blood pressure.  Patient will likely come in with elevated blood pressure, however, anticipate it may improve to acceptable range when she is able to lay down and rest.  Patient does understand that if it is prohibitively high on day of surgery could be canceled.  We discussed at length the need for him to follow-up with primary care after surgery given the risks of long-term untreated hypertension.  He did verbalize understanding.  Preop labs reviewed, unremarkable.    Wynonia Musty [facility] Short Stay Center/Anesthesiology Phone [phone] 02/26/2021 4:38 PM

## 2021-02-28 ENCOUNTER — Encounter (HOSPITAL_COMMUNITY): Payer: Self-pay | Admitting: Neurosurgery

## 2021-02-28 ENCOUNTER — Inpatient Hospital Stay (HOSPITAL_COMMUNITY): Payer: BC Managed Care – PPO | Admitting: Anesthesiology

## 2021-02-28 ENCOUNTER — Inpatient Hospital Stay (HOSPITAL_COMMUNITY): Payer: BC Managed Care – PPO

## 2021-02-28 ENCOUNTER — Other Ambulatory Visit: Payer: Self-pay

## 2021-02-28 ENCOUNTER — Encounter (HOSPITAL_COMMUNITY)
Admission: RE | Disposition: A | Discharge status: Home / Self Care | Payer: Self-pay | Source: Home / Self Care | Attending: Neurosurgery

## 2021-02-28 ENCOUNTER — Inpatient Hospital Stay (HOSPITAL_COMMUNITY): Payer: BC Managed Care – PPO | Admitting: Physician Assistant

## 2021-02-28 ENCOUNTER — Inpatient Hospital Stay (HOSPITAL_COMMUNITY)
Admission: RE | Admit: 2021-02-28 | Discharge: 2021-03-01 | DRG: 983 | Disposition: A | Discharge status: Home / Self Care | Payer: BC Managed Care – PPO | Attending: Neurosurgery | Admitting: Neurosurgery

## 2021-02-28 DIAGNOSIS — Z419 Encounter for procedure for purposes other than remedying health state, unspecified: Secondary | ICD-10-CM

## 2021-02-28 DIAGNOSIS — I770 Arteriovenous fistula, acquired: Secondary | ICD-10-CM | POA: Diagnosis present

## 2021-02-28 DIAGNOSIS — Z20822 Contact with and (suspected) exposure to covid-19: Secondary | ICD-10-CM | POA: Diagnosis present

## 2021-02-28 DIAGNOSIS — I4510 Unspecified right bundle-branch block: Secondary | ICD-10-CM | POA: Diagnosis present

## 2021-02-28 DIAGNOSIS — I10 Essential (primary) hypertension: Secondary | ICD-10-CM | POA: Diagnosis present

## 2021-02-28 HISTORY — PX: LUMBAR LAMINECTOMY/DECOMPRESSION MICRODISCECTOMY: SHX5026

## 2021-02-28 IMAGING — CR DG LUMBAR SPINE 2-3V
2 series · 2 of 2 positions shown · non-contrast
Comparison: None.

CLINICAL DATA: Opening films for L3 laminectomy

EXAM:
LUMBAR SPINE - 2-3 VIEW

[lateral (1 of 2)]
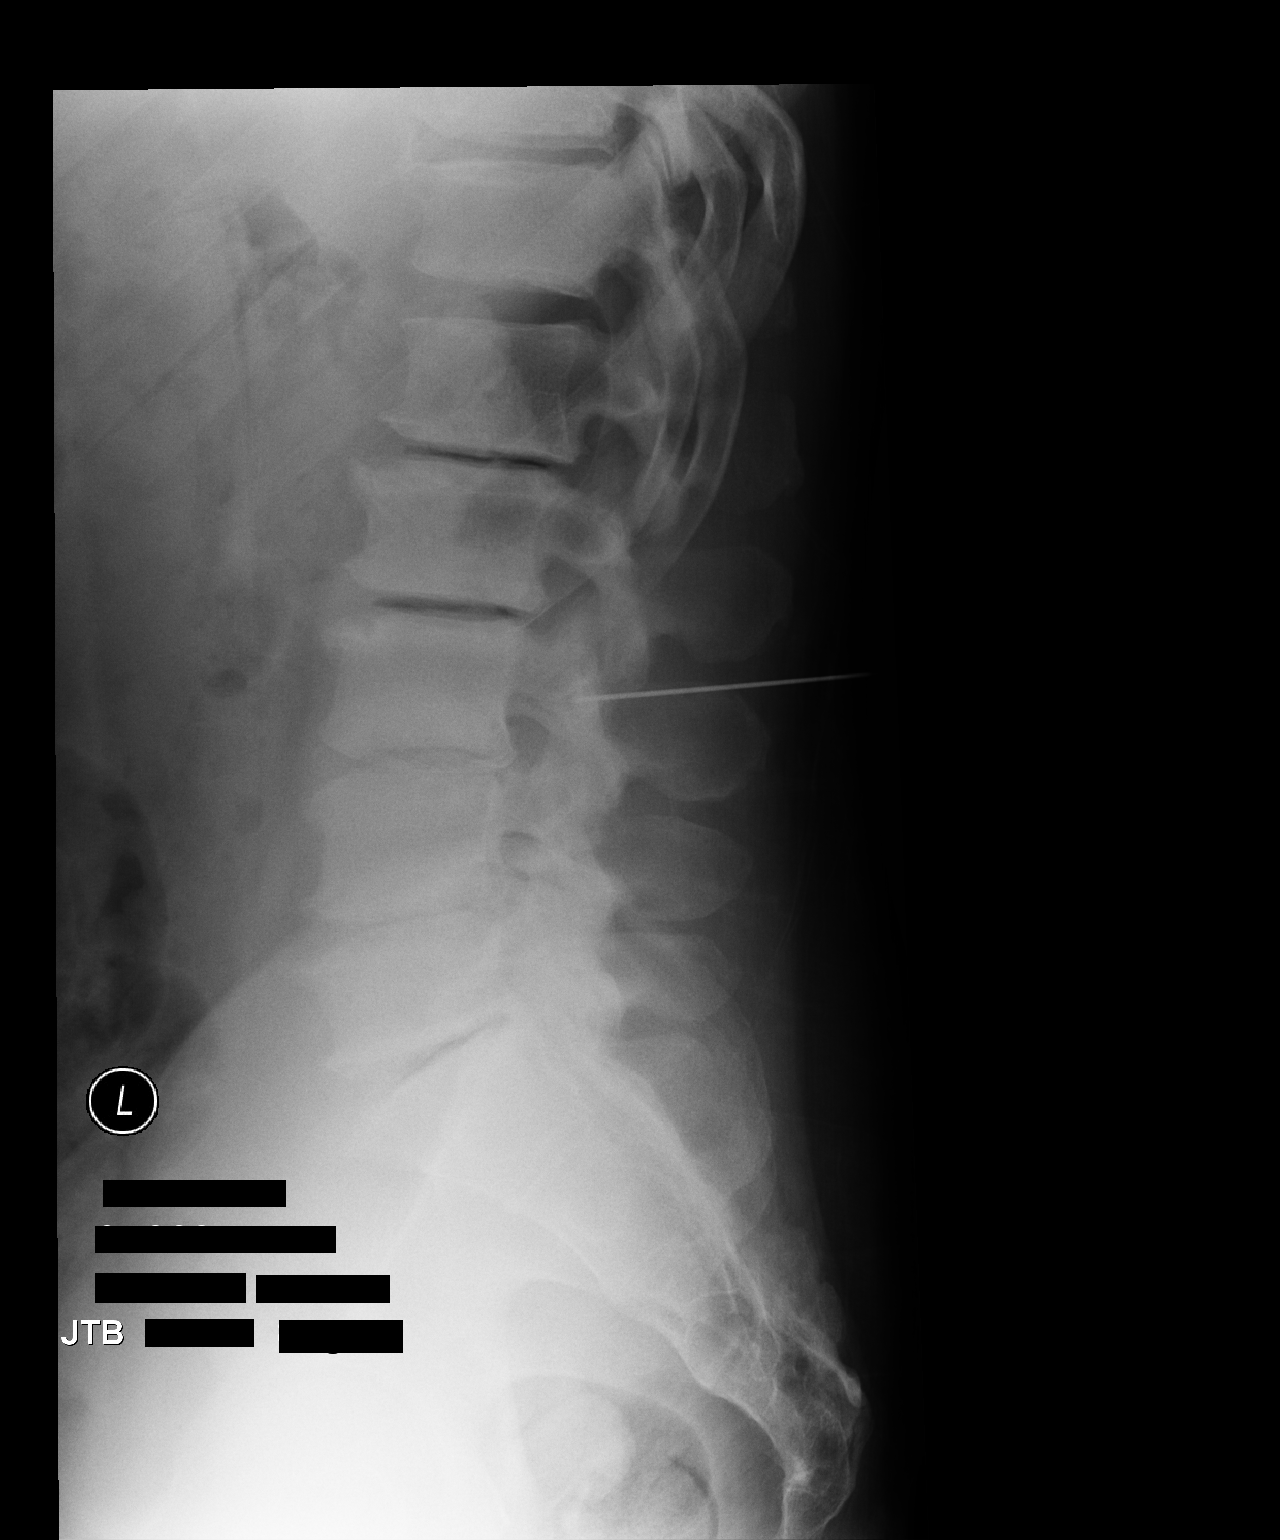

[lateral (2 of 2)]
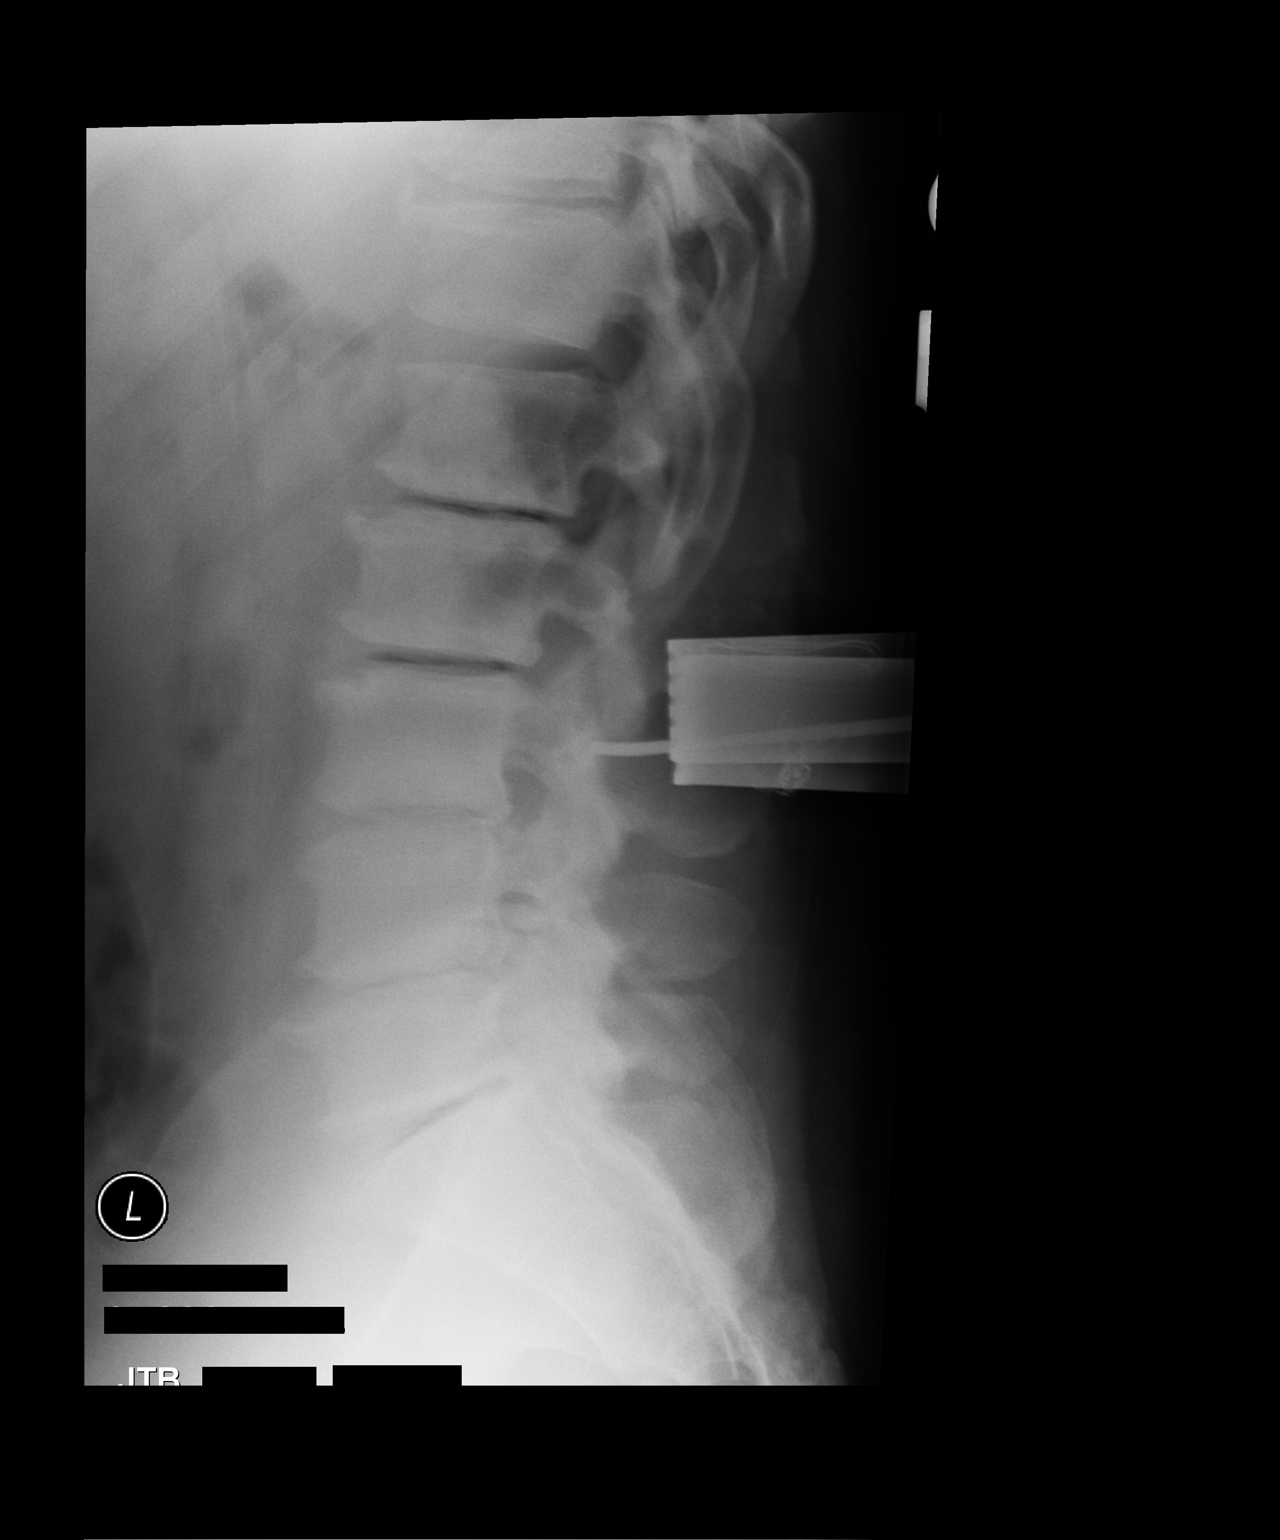

[2 of 2 positions shown; findings below may reference images not displayed]

FINDINGS: Two lateral lumbar inter operative radiographs. Initial film
demonstrates sharp tip probe at the L3 vertebral body level. Second
film demonstrates blunt tip probe and skin spreaders at the L3
vertebral body level.
IMPRESSION: Intraoperative views as above.

## 2021-02-28 SURGERY — LUMBAR LAMINECTOMY/DECOMPRESSION MICRODISCECTOMY 1 LEVEL
Anesthesia: General | Laterality: Left

## 2021-02-28 MED ORDER — METHYLPREDNISOLONE ACETATE 80 MG/ML IJ SUSP
INTRAMUSCULAR | Status: AC
  Filled 2021-02-28: qty 1

## 2021-02-28 MED ORDER — ACETAMINOPHEN 325 MG PO TABS: 325.0000 mg | ORAL_TABLET | ORAL | Status: DC | PRN

## 2021-02-28 MED ORDER — FENTANYL CITRATE (PF) 100 MCG/2ML IJ SOLN
INTRAMUSCULAR | Status: AC
  Filled 2021-02-28: qty 2

## 2021-02-28 MED ORDER — ACETAMINOPHEN 160 MG/5ML PO SOLN: 325.0000 mg | ORAL | Status: DC | PRN

## 2021-02-28 MED ORDER — ONDANSETRON HCL 4 MG/2ML IJ SOLN: 4.0000 mg | Freq: Once | INTRAMUSCULAR | Status: DC | PRN

## 2021-02-28 MED ORDER — BUPIVACAINE HCL (PF) 0.5 % IJ SOLN
INTRAMUSCULAR | Status: DC | PRN
  Administered 2021-02-28: 5 mL

## 2021-02-28 MED ORDER — INDOCYANINE GREEN 25 MG IV SOLR
INTRAVENOUS | Status: DC | PRN
  Administered 2021-02-28 (×2): 12.5 mg via INTRAVENOUS

## 2021-02-28 MED ORDER — FENTANYL CITRATE (PF) 250 MCG/5ML IJ SOLN
INTRAMUSCULAR | Status: DC | PRN
  Administered 2021-02-28: 100 ug via INTRAVENOUS
  Administered 2021-02-28 (×3): 50 ug via INTRAVENOUS

## 2021-02-28 MED ORDER — ONDANSETRON HCL 4 MG PO TABS: 4.0000 mg | ORAL_TABLET | Freq: Four times a day (QID) | ORAL | Status: DC | PRN

## 2021-02-28 MED ORDER — HEMOSTATIC AGENTS (NO CHARGE) OPTIME
TOPICAL | Status: DC | PRN
  Administered 2021-02-28: 1 via TOPICAL

## 2021-02-28 MED ORDER — ONDANSETRON HCL 4 MG/2ML IJ SOLN: 4.0000 mg | Freq: Four times a day (QID) | INTRAMUSCULAR | Status: DC | PRN

## 2021-02-28 MED ORDER — OXYCODONE HCL 5 MG/5ML PO SOLN: 5.0000 mg | Freq: Once | ORAL | Status: DC | PRN

## 2021-02-28 MED ORDER — ONDANSETRON HCL 4 MG/2ML IJ SOLN
INTRAMUSCULAR | Status: AC
  Filled 2021-02-28: qty 2

## 2021-02-28 MED ORDER — METHOCARBAMOL 500 MG PO TABS
500.0000 mg | ORAL_TABLET | Freq: Four times a day (QID) | ORAL | Status: DC | PRN
  Administered 2021-02-28 – 2021-03-01 (×2): 500 mg via ORAL
  Filled 2021-02-28 (×2): qty 1

## 2021-02-28 MED ORDER — ACETAMINOPHEN 500 MG PO TABS
ORAL_TABLET | ORAL | Status: AC
  Administered 2021-02-28: 1000 mg via ORAL
  Filled 2021-02-28: qty 2

## 2021-02-28 MED ORDER — INDOCYANINE GREEN 25 MG IV SOLR
INTRAVENOUS | Status: AC
  Filled 2021-02-28: qty 10

## 2021-02-28 MED ORDER — 0.9 % SODIUM CHLORIDE (POUR BTL) OPTIME
TOPICAL | Status: DC | PRN
Start: 2021-02-28 — End: 2021-02-28
  Administered 2021-02-28: 1000 mL

## 2021-02-28 MED ORDER — OXYCODONE HCL 5 MG PO TABS: 5.0000 mg | ORAL_TABLET | Freq: Once | ORAL | Status: DC | PRN

## 2021-02-28 MED ORDER — PROPOFOL 10 MG/ML IV BOLUS
INTRAVENOUS | Status: DC | PRN
Start: 2021-02-28 — End: 2021-02-28
  Administered 2021-02-28: 50 mg via INTRAVENOUS
  Administered 2021-02-28: 100 mg via INTRAVENOUS
  Administered 2021-02-28: 50 mg via INTRAVENOUS

## 2021-02-28 MED ORDER — ORAL CARE MOUTH RINSE: 15.0000 mL | Freq: Once | OROMUCOSAL | Status: AC

## 2021-02-28 MED ORDER — LACTATED RINGERS IV SOLN: INTRAVENOUS | Status: DC

## 2021-02-28 MED ORDER — ACETAMINOPHEN 650 MG RE SUPP: 650.0000 mg | RECTAL | Status: DC | PRN

## 2021-02-28 MED ORDER — PANTOPRAZOLE SODIUM 40 MG IV SOLR
40.0000 mg | Freq: Every day | INTRAVENOUS | Status: DC
  Administered 2021-02-28: 40 mg via INTRAVENOUS
  Filled 2021-02-28: qty 40

## 2021-02-28 MED ORDER — KETAMINE HCL 10 MG/ML IJ SOLN
INTRAMUSCULAR | Status: DC | PRN
  Administered 2021-02-28: 30 mg via INTRAVENOUS
  Administered 2021-02-28: 20 mg via INTRAVENOUS

## 2021-02-28 MED ORDER — KETAMINE HCL 50 MG/5ML IJ SOSY
PREFILLED_SYRINGE | INTRAMUSCULAR | Status: AC
  Filled 2021-02-28: qty 5

## 2021-02-28 MED ORDER — LIDOCAINE 2% (20 MG/ML) 5 ML SYRINGE
INTRAMUSCULAR | Status: DC | PRN
  Administered 2021-02-28: 100 mg via INTRAVENOUS

## 2021-02-28 MED ORDER — CEFAZOLIN SODIUM-DEXTROSE 2-4 GM/100ML-% IV SOLN
INTRAVENOUS | Status: AC
  Filled 2021-02-28: qty 100

## 2021-02-28 MED ORDER — CHLORHEXIDINE GLUCONATE CLOTH 2 % EX PADS: 6.0000 | MEDICATED_PAD | Freq: Once | CUTANEOUS | Status: DC

## 2021-02-28 MED ORDER — CEFAZOLIN SODIUM-DEXTROSE 2-4 GM/100ML-% IV SOLN
2.0000 g | Freq: Three times a day (TID) | INTRAVENOUS | Status: AC
  Administered 2021-02-28 – 2021-03-01 (×2): 2 g via INTRAVENOUS
  Filled 2021-02-28 (×2): qty 100

## 2021-02-28 MED ORDER — SODIUM CHLORIDE 0.9% FLUSH: 3.0000 mL | Freq: Two times a day (BID) | INTRAVENOUS | Status: DC

## 2021-02-28 MED ORDER — CHLORHEXIDINE GLUCONATE 0.12 % MT SOLN
OROMUCOSAL | Status: AC
  Administered 2021-02-28: 15 mL via OROMUCOSAL
  Filled 2021-02-28: qty 15

## 2021-02-28 MED ORDER — LIDOCAINE-EPINEPHRINE 1 %-1:100000 IJ SOLN
INTRAMUSCULAR | Status: DC | PRN
  Administered 2021-02-28: 5 mL

## 2021-02-28 MED ORDER — OXYCODONE HCL 5 MG PO TABS: 5.0000 mg | ORAL_TABLET | ORAL | Status: DC | PRN

## 2021-02-28 MED ORDER — CELECOXIB 200 MG PO CAPS: 200.0000 mg | ORAL_CAPSULE | Freq: Once | ORAL | Status: AC

## 2021-02-28 MED ORDER — SODIUM CHLORIDE 0.9% FLUSH: 3.0000 mL | INTRAVENOUS | Status: DC | PRN

## 2021-02-28 MED ORDER — ACETAMINOPHEN 325 MG PO TABS
650.0000 mg | ORAL_TABLET | ORAL | Status: DC | PRN
  Administered 2021-02-28 – 2021-03-01 (×2): 650 mg via ORAL
  Filled 2021-02-28 (×2): qty 2

## 2021-02-28 MED ORDER — ROCURONIUM BROMIDE 10 MG/ML (PF) SYRINGE
PREFILLED_SYRINGE | INTRAVENOUS | Status: AC
  Filled 2021-02-28: qty 20

## 2021-02-28 MED ORDER — LIDOCAINE-EPINEPHRINE 1 %-1:100000 IJ SOLN
INTRAMUSCULAR | Status: AC
  Filled 2021-02-28: qty 1

## 2021-02-28 MED ORDER — FENTANYL CITRATE (PF) 250 MCG/5ML IJ SOLN
INTRAMUSCULAR | Status: AC
  Filled 2021-02-28: qty 5

## 2021-02-28 MED ORDER — OXYCODONE HCL 5 MG PO TABS
10.0000 mg | ORAL_TABLET | ORAL | Status: DC | PRN
  Administered 2021-02-28 (×2): 10 mg via ORAL
  Filled 2021-02-28 (×3): qty 2

## 2021-02-28 MED ORDER — GLYCOPYRROLATE 0.2 MG/ML IJ SOLN
INTRAMUSCULAR | Status: DC | PRN
Start: 2021-02-28 — End: 2021-02-28
  Administered 2021-02-28: .2 mg via INTRAVENOUS

## 2021-02-28 MED ORDER — THROMBIN 5000 UNITS EX SOLR
CUTANEOUS | Status: DC | PRN
  Administered 2021-02-28 (×2): 5000 [IU] via TOPICAL

## 2021-02-28 MED ORDER — LABETALOL HCL 5 MG/ML IV SOLN
INTRAVENOUS | Status: AC
  Administered 2021-02-28: 5 mg via INTRAVENOUS
  Filled 2021-02-28: qty 4

## 2021-02-28 MED ORDER — FENTANYL CITRATE (PF) 100 MCG/2ML IJ SOLN
50.0000 ug | Freq: Once | INTRAMUSCULAR | Status: DC
Start: 2021-02-28 — End: 2021-02-28

## 2021-02-28 MED ORDER — BUPIVACAINE HCL (PF) 0.5 % IJ SOLN
INTRAMUSCULAR | Status: AC
  Filled 2021-02-28: qty 30

## 2021-02-28 MED ORDER — PHENYLEPHRINE 40 MCG/ML (10ML) SYRINGE FOR IV PUSH (FOR BLOOD PRESSURE SUPPORT)
PREFILLED_SYRINGE | INTRAVENOUS | Status: AC
  Filled 2021-02-28: qty 10

## 2021-02-28 MED ORDER — ALBUMIN HUMAN 5 % IV SOLN: INTRAVENOUS | Status: DC | PRN

## 2021-02-28 MED ORDER — CELECOXIB 200 MG PO CAPS
ORAL_CAPSULE | ORAL | Status: AC
  Administered 2021-02-28: 200 mg via ORAL
  Filled 2021-02-28: qty 1

## 2021-02-28 MED ORDER — MEPERIDINE HCL 25 MG/ML IJ SOLN: 6.2500 mg | INTRAMUSCULAR | Status: DC | PRN

## 2021-02-28 MED ORDER — DEXMEDETOMIDINE (PRECEDEX) IN NS 20 MCG/5ML (4 MCG/ML) IV SYRINGE
PREFILLED_SYRINGE | INTRAVENOUS | Status: DC | PRN
  Administered 2021-02-28 (×2): 4 ug via INTRAVENOUS
  Administered 2021-02-28: 8 ug via INTRAVENOUS
  Administered 2021-02-28: 4 ug via INTRAVENOUS

## 2021-02-28 MED ORDER — MENTHOL 3 MG MT LOZG: 1.0000 | LOZENGE | OROMUCOSAL | Status: DC | PRN

## 2021-02-28 MED ORDER — MIDAZOLAM HCL 2 MG/2ML IJ SOLN
INTRAMUSCULAR | Status: DC | PRN
Start: 2021-02-28 — End: 2021-02-28
  Administered 2021-02-28: 2 mg via INTRAVENOUS

## 2021-02-28 MED ORDER — SODIUM CHLORIDE 0.9 % IV SOLN: INTRAVENOUS | Status: DC

## 2021-02-28 MED ORDER — CEFAZOLIN SODIUM-DEXTROSE 2-4 GM/100ML-% IV SOLN
2.0000 g | INTRAVENOUS | Status: AC
  Administered 2021-02-28: 2 g via INTRAVENOUS

## 2021-02-28 MED ORDER — SODIUM CHLORIDE 0.9 % IV SOLN: 250.0000 mL | INTRAVENOUS | Status: DC

## 2021-02-28 MED ORDER — DEXMEDETOMIDINE (PRECEDEX) IN NS 20 MCG/5ML (4 MCG/ML) IV SYRINGE
PREFILLED_SYRINGE | INTRAVENOUS | Status: AC
  Filled 2021-02-28: qty 5

## 2021-02-28 MED ORDER — MIDAZOLAM HCL 2 MG/2ML IJ SOLN
INTRAMUSCULAR | Status: AC
  Filled 2021-02-28: qty 2

## 2021-02-28 MED ORDER — METHOCARBAMOL 1000 MG/10ML IJ SOLN
500.0000 mg | Freq: Four times a day (QID) | INTRAVENOUS | Status: DC | PRN
  Filled 2021-02-28: qty 5

## 2021-02-28 MED ORDER — DOCUSATE SODIUM 100 MG PO CAPS
100.0000 mg | ORAL_CAPSULE | Freq: Two times a day (BID) | ORAL | Status: DC
  Administered 2021-02-28 – 2021-03-01 (×2): 100 mg via ORAL
  Filled 2021-02-28 (×2): qty 1

## 2021-02-28 MED ORDER — BISACODYL 10 MG RE SUPP: 10.0000 mg | Freq: Every day | RECTAL | Status: DC | PRN

## 2021-02-28 MED ORDER — PHENOL 1.4 % MT LIQD: 1.0000 | OROMUCOSAL | Status: DC | PRN

## 2021-02-28 MED ORDER — DEXAMETHASONE SODIUM PHOSPHATE 10 MG/ML IJ SOLN
INTRAMUSCULAR | Status: AC
  Filled 2021-02-28: qty 1

## 2021-02-28 MED ORDER — SUGAMMADEX SODIUM 200 MG/2ML IV SOLN
INTRAVENOUS | Status: DC | PRN
  Administered 2021-02-28: 448 mg via INTRAVENOUS

## 2021-02-28 MED ORDER — ACETAMINOPHEN 500 MG PO TABS
1000.0000 mg | ORAL_TABLET | Freq: Once | ORAL | Status: AC
Start: 2021-02-28 — End: 2021-02-28

## 2021-02-28 MED ORDER — PHENYLEPHRINE HCL-NACL 20-0.9 MG/250ML-% IV SOLN
INTRAVENOUS | Status: DC | PRN
  Administered 2021-02-28: 55 ug/min via INTRAVENOUS

## 2021-02-28 MED ORDER — LIDOCAINE 2% (20 MG/ML) 5 ML SYRINGE
INTRAMUSCULAR | Status: AC
  Filled 2021-02-28: qty 5

## 2021-02-28 MED ORDER — MORPHINE SULFATE (PF) 4 MG/ML IV SOLN: 4.0000 mg | INTRAVENOUS | Status: DC | PRN

## 2021-02-28 MED ORDER — THROMBIN 5000 UNITS EX SOLR
CUTANEOUS | Status: AC
  Filled 2021-02-28: qty 15000

## 2021-02-28 MED ORDER — PHENYLEPHRINE 40 MCG/ML (10ML) SYRINGE FOR IV PUSH (FOR BLOOD PRESSURE SUPPORT)
PREFILLED_SYRINGE | INTRAVENOUS | Status: DC | PRN
Start: 2021-02-28 — End: 2021-02-28
  Administered 2021-02-28: 80 ug via INTRAVENOUS

## 2021-02-28 MED ORDER — DEXAMETHASONE SODIUM PHOSPHATE 10 MG/ML IJ SOLN
INTRAMUSCULAR | Status: DC | PRN
Start: 2021-02-28 — End: 2021-02-28
  Administered 2021-02-28: 10 mg via INTRAVENOUS

## 2021-02-28 MED ORDER — ONDANSETRON HCL 4 MG/2ML IJ SOLN
INTRAMUSCULAR | Status: DC | PRN
  Administered 2021-02-28: 4 mg via INTRAVENOUS

## 2021-02-28 MED ORDER — CHLORHEXIDINE GLUCONATE 0.12 % MT SOLN: 15.0000 mL | Freq: Once | OROMUCOSAL | Status: AC

## 2021-02-28 MED ORDER — LABETALOL HCL 5 MG/ML IV SOLN: 5.0000 mg | INTRAVENOUS | Status: DC | PRN

## 2021-02-28 MED ORDER — THROMBIN 5000 UNITS EX SOLR
OROMUCOSAL | Status: DC | PRN
  Administered 2021-02-28: 5 mL via TOPICAL

## 2021-02-28 MED ORDER — ROCURONIUM BROMIDE 10 MG/ML (PF) SYRINGE
PREFILLED_SYRINGE | INTRAVENOUS | Status: DC | PRN
Start: 2021-02-28 — End: 2021-02-28
  Administered 2021-02-28: 40 mg via INTRAVENOUS
  Administered 2021-02-28: 80 mg via INTRAVENOUS

## 2021-02-28 MED ORDER — FENTANYL CITRATE (PF) 100 MCG/2ML IJ SOLN
25.0000 ug | INTRAMUSCULAR | Status: DC | PRN
  Administered 2021-02-28 (×3): 50 ug via INTRAVENOUS

## 2021-02-28 MED ORDER — SENNA 8.6 MG PO TABS
1.0000 | ORAL_TABLET | Freq: Two times a day (BID) | ORAL | Status: DC
  Administered 2021-02-28 – 2021-03-01 (×2): 8.6 mg via ORAL
  Filled 2021-02-28 (×2): qty 1

## 2021-02-28 SURGICAL SUPPLY — 63 items
BAG COUNTER SPONGE SURGICOUNT (BAG) ×3 IMPLANT
BAND RUBBER #18 3X1/16 STRL (MISCELLANEOUS) ×4 IMPLANT
BENZOIN TINCTURE PRP APPL 2/3 (GAUZE/BANDAGES/DRESSINGS) IMPLANT
BLADE CLIPPER SURG (BLADE) IMPLANT
BLADE SURG 11 STRL SS (BLADE) ×2 IMPLANT
BUR MATCHSTICK NEURO 3.0 LAGG (BURR) ×2 IMPLANT
BUR PRECISION FLUTE 5.0 (BURR) ×1 IMPLANT
CANISTER SUCT 3000ML PPV (MISCELLANEOUS) ×2 IMPLANT
CARTRIDGE OIL MAESTRO DRILL (MISCELLANEOUS) ×1 IMPLANT
DECANTER SPIKE VIAL GLASS SM (MISCELLANEOUS) ×2 IMPLANT
DERMABOND ADVANCED (GAUZE/BANDAGES/DRESSINGS) ×1
DERMABOND ADVANCED .7 DNX12 (GAUZE/BANDAGES/DRESSINGS) ×1 IMPLANT
DIFFUSER DRILL AIR PNEUMATIC (MISCELLANEOUS) ×2 IMPLANT
DRAPE LAPAROTOMY 100X72X124 (DRAPES) ×2 IMPLANT
DRAPE MICROSCOPE LEICA (MISCELLANEOUS) ×2 IMPLANT
DRAPE SURG 17X23 STRL (DRAPES) ×2 IMPLANT
DRSG OPSITE POSTOP 3X4 (GAUZE/BANDAGES/DRESSINGS) ×2 IMPLANT
DRSG OPSITE POSTOP 4X6 (GAUZE/BANDAGES/DRESSINGS) ×1 IMPLANT
DURAPREP 26ML APPLICATOR (WOUND CARE) ×2 IMPLANT
ELECT REM PT RETURN 9FT ADLT (ELECTROSURGICAL) ×2
ELECTRODE REM PT RTRN 9FT ADLT (ELECTROSURGICAL) ×1 IMPLANT
GAUZE 4X4 16PLY ~~LOC~~+RFID DBL (SPONGE) ×1 IMPLANT
GAUZE SPONGE 4X4 12PLY STRL (GAUZE/BANDAGES/DRESSINGS) IMPLANT
GLOVE EXAM NITRILE XL STR (GLOVE) IMPLANT
GLOVE SURG ENC MOIS LTX SZ7.5 (GLOVE) ×1 IMPLANT
GLOVE SURG LTX SZ7 (GLOVE) ×2 IMPLANT
GLOVE SURG LTX SZ7.5 (GLOVE) ×1 IMPLANT
GLOVE SURG POLYISO LF SZ7 (GLOVE) ×1 IMPLANT
GLOVE SURG UNDER POLY LF SZ7.5 (GLOVE) ×6 IMPLANT
GOWN STRL REUS W/ TWL LRG LVL3 (GOWN DISPOSABLE) ×2 IMPLANT
GOWN STRL REUS W/ TWL XL LVL3 (GOWN DISPOSABLE) IMPLANT
GOWN STRL REUS W/TWL 2XL LVL3 (GOWN DISPOSABLE) IMPLANT
GOWN STRL REUS W/TWL LRG LVL3 (GOWN DISPOSABLE) ×2
GOWN STRL REUS W/TWL XL LVL3 (GOWN DISPOSABLE) ×2
HEMOSTAT POWDER KIT SURGIFOAM (HEMOSTASIS) ×2 IMPLANT
KIT BASIN OR (CUSTOM PROCEDURE TRAY) ×2 IMPLANT
KIT TURNOVER KIT B (KITS) ×2 IMPLANT
NDL HYPO 18GX1.5 BLUNT FILL (NEEDLE) IMPLANT
NDL SPNL 18GX3.5 QUINCKE PK (NEEDLE) IMPLANT
NEEDLE HYPO 18GX1.5 BLUNT FILL (NEEDLE) IMPLANT
NEEDLE HYPO 22GX1.5 SAFETY (NEEDLE) ×2 IMPLANT
NEEDLE SPNL 18GX3.5 QUINCKE PK (NEEDLE) ×2 IMPLANT
NS IRRIG 1000ML POUR BTL (IV SOLUTION) ×2 IMPLANT
OIL CARTRIDGE MAESTRO DRILL (MISCELLANEOUS) ×2
PACK LAMINECTOMY NEURO (CUSTOM PROCEDURE TRAY) ×2 IMPLANT
PAD ARMBOARD 7.5X6 YLW CONV (MISCELLANEOUS) ×5 IMPLANT
PATTIES SURGICAL .25X.25 (GAUZE/BANDAGES/DRESSINGS) ×1 IMPLANT
PATTIES SURGICAL 1X1 (DISPOSABLE) ×1 IMPLANT
SEALANT ADHERUS EXTEND TIP (MISCELLANEOUS) ×1 IMPLANT
SPONGE SURGIFOAM ABS GEL SZ50 (HEMOSTASIS) ×2 IMPLANT
SPONGE T-LAP 4X18 ~~LOC~~+RFID (SPONGE) ×1 IMPLANT
STRIP CLOSURE SKIN 1/2X4 (GAUZE/BANDAGES/DRESSINGS) IMPLANT
SUT NURALON 4 0 TR CR/8 (SUTURE) ×1 IMPLANT
SUT VIC AB 0 CT1 18XCR BRD8 (SUTURE) ×1 IMPLANT
SUT VIC AB 0 CT1 8-18 (SUTURE) ×1
SUT VIC AB 2-0 CT1 18 (SUTURE) IMPLANT
SUT VICRYL 3-0 RB1 18 ABS (SUTURE) ×4 IMPLANT
SYR 3ML LL SCALE MARK (SYRINGE) IMPLANT
TIP NONSTICK .5MMX23CM (INSTRUMENTS) ×1
TIP NONSTICK .5X23 (INSTRUMENTS) IMPLANT
TOWEL GREEN STERILE (TOWEL DISPOSABLE) ×2 IMPLANT
TOWEL GREEN STERILE FF (TOWEL DISPOSABLE) ×2 IMPLANT
WATER STERILE IRR 1000ML POUR (IV SOLUTION) ×2 IMPLANT

## 2021-02-28 NOTE — Progress Notes (Signed)
Patient's BP elevated in Short Stay on day of surgery.  Dr. Krista Blue notified.  No new orders given at this time.  Patient stated he is in pain.  180/103 - right arm @ 0935 212/123 - left arm @ 0955 209/118 - left arm @ 1016

## 2021-02-28 NOTE — Transfer of Care (Signed)
Immediate Anesthesia Transfer of Care Note  Patient: Sean Mcdaniel  Procedure(s) Performed: LUMBAR THREE LAMINECTOMY,DISCONNECTION OF LEFT LUMBAR THREE FISTULA (Left)  Patient Location: PACU  Anesthesia Type:General  Level of Consciousness: awake, alert , oriented and patient cooperative  Airway & Oxygen Therapy: Patient Spontanous Breathing and Patient connected to face mask oxygen  Post-op Assessment: Report given to RN, Post -op Vital signs reviewed and stable and Patient moving all extremities X 4  Post vital signs: Reviewed and stable  Last Vitals:  Vitals Value Taken Time  BP 159/94 02/28/21 1728  Temp    Pulse 77 02/28/21 1731  Resp 21 02/28/21 1731  SpO2 95 % 02/28/21 1731  Vitals shown include unvalidated device data.  Last Pain:  Vitals:   02/28/21 0935  TempSrc: Oral  PainSc: 5       Patients Stated Pain Goal: 2 (02/28/21 0935)  Complications: No notable events documented.

## 2021-02-28 NOTE — H&P (Signed)
Chief Complaint  Spinal Fistula  History of Present Illness  Sean Mcdaniel is a 51 y.o. male who has been followed in the outpatient neurosurgery clinic with slow progressive lower extremity weakness and paresthesias.  His work-up has included MRA of the thoracic and lumbar spines revealing the likelihood of a spinal fistula.  He subsequently underwent spinal angiogram which did reveal the presence of a likely dural fistula from the left L3 radical.  He therefore presents today for surgical ligation of the fistula.  Past Medical History   Past Medical History:  Diagnosis Date   Hypertension     Past Surgical History   Past Surgical History:  Procedure Laterality Date   IR ANGIO/SPINAL LEFT  01/24/2021   IR ANGIO/SPINAL LEFT  01/24/2021   IR ANGIO/SPINAL LEFT  01/24/2021   IR ANGIO/SPINAL LEFT  01/24/2021   IR ANGIO/SPINAL LEFT  01/24/2021   IR ANGIO/SPINAL LEFT  01/24/2021   IR ANGIO/SPINAL LEFT  01/24/2021   IR ANGIO/SPINAL LEFT  01/24/2021   IR ANGIO/SPINAL LEFT  01/24/2021   IR ANGIO/SPINAL LEFT  01/24/2021   IR ANGIO/SPINAL LEFT  01/24/2021   IR ANGIO/SPINAL RIGHT  01/24/2021   IR ANGIO/SPINAL RIGHT  01/24/2021   IR ANGIO/SPINAL RIGHT  01/24/2021   IR ANGIO/SPINAL RIGHT  01/24/2021   IR ANGIO/SPINAL RIGHT  01/24/2021   IR ANGIO/SPINAL RIGHT  01/24/2021   IR ANGIO/SPINAL RIGHT  01/24/2021   IR ANGIO/SPINAL RIGHT  01/24/2021   IR ANGIO/SPINAL RIGHT  01/24/2021   IR ANGIO/SPINAL RIGHT  01/24/2021   IR ANGIO/SPINAL RIGHT  01/24/2021   NO PAST SURGERIES     RADIOLOGY WITH ANESTHESIA N/A 01/24/2021   Procedure: Spinal Angiogram;  Surgeon: Consuella Lose, MD;  Location: [place];  Service: Radiology;  Laterality: N/A;    Social History   Social History   Tobacco Use   Smoking status: Never   Smokeless tobacco: Never  Vaping Use   Vaping Use: Never used  Substance Use Topics   Alcohol use: Yes    Comment: occ   Drug use: Never    Medications   Prior to  Admission medications   Medication Sig Start Date End Date Taking? Authorizing Provider  ibuprofen (ADVIL) 200 MG tablet Take 800-1,200 mg by mouth every 6 (six) hours as needed for moderate pain.   Yes [provider]  Multiple Vitamin (MULTIVITAMIN) tablet Take 1 tablet by mouth daily.   Yes [provider]  diclofenac (VOLTAREN) 50 MG EC tablet Take 1 tablet (50 mg total) by mouth 2 (two) times daily. Patient not taking: Reported on 02/19/2021 01/24/21   Consuella Lose, MD    Allergies  No Known Allergies  Review of Systems  ROS  Neurologic Exam  Awake, alert, oriented Memory and concentration grossly intact Speech fluent, appropriate CN grossly intact Motor exam: Upper Extremities Deltoid Bicep Tricep Grip  Right 5/5 5/5 5/5 5/5  Left 5/5 5/5 5/5 5/5   Lower Extremities IP Quad PF DF EHL  Right 5/5 5/5 5/5 5/5 5/5  Left 5/5 5/5 5/5 5/5 5/5   Sensation grossly intact to LT  Imaging  MRA of the lumbar spine in addition to diagnostic spinal angiogram were reviewed.  These demonstrate the presence of a spinal dural fistula arising from the left L3 radical with significant early opacification of the parimedullary venous plexus.  Of note, significant contribution to the anterior spinal artery is noted from the left T10 segmental vessel.  Impression  - 51 y.o. 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  We have discussed treatment options including my recommendation for surgical ligation.  The details of the procedure as well as the expected postoperative course and recovery were all reviewed in detail with the patient and his family.  The associated risks were also reviewed in detail.  All his questions today were answered and he provided  informed consent to proceed.   Consuella Lose, MD [facility]

## 2021-02-28 NOTE — Anesthesia Procedure Notes (Signed)
Procedure Name: Intubation Date/Time: 02/28/2021 2:13 PM Performed by: Cathren Harsh, CRNA Pre-anesthesia Checklist: Patient identified, Emergency Drugs available, Suction available and Patient being monitored Patient Re-evaluated:Patient Re-evaluated prior to induction Oxygen Delivery Method: Circle System Utilized Preoxygenation: Pre-oxygenation with 100% oxygen Induction Type: IV induction Ventilation: Mask ventilation without difficulty Laryngoscope Size: Mac and 4 Grade View: Grade III Tube type: Oral Tube size: 7.5 mm Number of attempts: 1 Airway Equipment and Method: Stylet and Oral airway Placement Confirmation: ETT inserted through vocal cords under direct vision, positive ETCO2 and breath sounds checked- equal and bilateral Secured at: 24 cm Tube secured with: Tape Dental Injury: Teeth and Oropharynx as per pre-operative assessment

## 2021-02-28 NOTE — Anesthesia Preprocedure Evaluation (Addendum)
Anesthesia Evaluation  Patient identified by MRN, date of birth, ID band Patient awake    Reviewed: Allergy & Precautions, NPO status , Patient's Chart, lab work & pertinent test results  History of Anesthesia Complications Negative for: history of anesthetic complications  Airway Mallampati: II  TM Distance: >3 FB Neck ROM: Full    Dental no notable dental hx. (+) Teeth Intact, Dental Advisory Given   Pulmonary neg pulmonary ROS,    Pulmonary exam normal breath sounds clear to auscultation       Cardiovascular hypertension, Normal cardiovascular exam Rhythm:Regular Rate:Normal  Patient will likely come in with elevated blood pressure, however, anticipate it may improve to acceptable range when she is able to lay down and rest.  Patient does understand that if it is prohibitively high on day of surgery could be canceled.  We discussed at length the need for him to follow-up with primary care after surgery given the risks of long-term untreated hypertension.  He did verbalize understanding.    Neuro/Psych  Neuromuscular disease negative neurological ROS  negative psych ROS   GI/Hepatic negative GI ROS, Neg liver ROS,   Endo/Other  negative endocrine ROS  Renal/GU negative Renal ROSLab Results      Component                Value               Date                      CREATININE               1.02                01/24/2021                BUN                      17                  01/24/2021                NA                       137                 01/24/2021                K                        4.0                 01/24/2021                CL                       106                 01/24/2021                CO2                      23                  01/24/2021                Musculoskeletal negative musculoskeletal ROS (+)  Abdominal (+) + obese (BMI 36.18),   Peds  Hematology negative hematology ROS (+)  Lab Results      Component                Value               Date                      WBC                      6.2                 01/24/2021                HGB                      16.0                01/24/2021                HCT                      47.5                01/24/2021                MCV                      93.3                01/24/2021                PLT                      210                 01/24/2021              Anesthesia Other Findings   Reproductive/Obstetrics                            Anesthesia Physical  Anesthesia Plan  ASA: 2  Anesthesia Plan: General   Post-op Pain Management: Tylenol PO (pre-op)   Induction: Intravenous  PONV Risk Score and Plan: 3 and Treatment may vary due to age or medical condition, Ondansetron and Midazolam  Airway Management Planned: Oral ETT  Additional Equipment: None  Intra-op Plan:   Post-operative Plan: Extubation in OR  Informed Consent: I have reviewed the patients History and Physical, chart, labs and discussed the procedure including the risks, benefits and alternatives for the proposed anesthesia with the patient or authorized representative who has indicated his/her understanding and acceptance.     Dental advisory given  Plan Discussed with: CRNA and Anesthesiologist  Anesthesia Plan Comments: (I discussed Mr. Auguste elevated blood pressure and he was informed that this level of hypertension puts him at increased risk for perioperative stroke that could result in serious morbidity and even mortality.  He wishes to proceed because of his pain and disability related to his neurologic condition.  He accepts this increased risk.)      Anesthesia Quick Evaluation

## 2021-02-28 NOTE — Op Note (Signed)
NEUROSURGERY OPERATIVE NOTE   PREOP DIAGNOSIS:  Left L3 spinal dural arteriovenous fistula   POSTOP DIAGNOSIS: Same  PROCEDURE: L2-3 laminectomy Ligation of left L3 dural arteriovenous fistula Use of intraoperative ICG videoangiography Use of intraoperative microscope for microdissection  SURGEON: Dr. Lisbeth Renshaw, MD  ASSISTANT: Dr. Hoyt Koch, Md  ANESTHESIA: General Endotracheal  EBL: 300cc  SPECIMENS: None  DRAINS: None  COMPLICATIONS: None immediate  CONDITION: Hemodynamically stable to PACU  HISTORY: Sean Mcdaniel is a 51 y.o. male initially followed in the outpatient neurosurgery clinic with slow but progressively worsening lower extremity weakness, paresthesias, and subjective bladder dysfunction.  His work-up included MRI of the thoracic and lumbar spine demonstrating significant degenerative disease of the lumbar spine however there was suggestion of possible spinal arteriovenous malformation.  He further underwent MR angiogram of the thoracic and lumbar spine again suggesting the presence of a dural arteriovenous malformation.  He then underwent confirmatory spinal angiogram which confirmed the presence of a dural fistula arising from the left L3 segmental vessel.  Of note, the anterior spinal artery was fed primarily from the left T10 segmental vessel.  With the patient's clinical and radiographic findings, surgical ligation of the fistula was recommended.  The risks, benefits, and alternatives to surgery as well as the expected postoperative course and recovery were all reviewed in detail with the patient and his wife.  After all their questions were answered informed consent was obtained and witnessed.  PROCEDURE IN DETAIL: The patient was brought to the operating room. After induction of general anesthesia, the patient was positioned on the operative table in the prone position. All pressure points were meticulously padded.  Midline skin incision was then  marked out and prepped and draped in the usual sterile fashion.  After timeout was conducted, a spinal needle was introduced to identify the surface projection of the L3 pedicle.  The midline skin incision was then infiltrated with local anesthetic with epinephrine.  Incision was then made sharply and carried down through the subcutaneous tissue.  The lumbodorsal fascia was identified and incised.  Subperiosteal dissection was then carried out along the L3 lamina and the inferior aspect of the L2 lamina.  Self-retaining retractors were placed.  A dissector was placed at the level of the L3 pedicle and intraoperative lateral x-ray was taken to confirm our location at the correct level.  At this point the spinous processes were removed with rongeurs.  The top of the L3 lamina as well as the bottom of the L2 lamina were then removed with the high-speed drill.  The ligamentum flavum was identified and removed piecemeal with Kerrison punches.  The underlying thecal sac was then identified.  The laminectomy was carried slightly more laterally on the left side.  Hemostasis on the epidural plane was then secured with bipolar electrocautery and morselized Gelfoam with thrombin.  At this point the microscope was draped sterilely and brought into the field and the remainder of the case was done under the microscope using microdissection technique.  The dura was opened slightly off the midline on the left side dorsally.  4 Nurolon stitches were used to tack up the dural edges.  The arachnoid was opened and the cauda equina nerve roots were gently retracted towards the right.  I was then able to identify the exiting right L3 nerve root.  Dissecting along the inferior border of the nerve root I then identified a relatively large what appeared to be arterialized segmental vessel, this appeared to be at least  the size of the L3 nerve root and was clearly pathologic.  At this point, I performed a ICG video angiography and  there was brisk opacification of this vessel suggesting it is indeed arterial.  Unfortunately, I did not have exposure of the cauda equina or spinal cord in order to visualize early opacification of the appearing medullary veins.  At this point, I carefully dissected the segmental vessel away from the dorsal and ventral L3 roots.  Once this was done I placed a temporary titanium clip proximally along this vessel.  Bipolar electrocautery was then used to ligate the vessel over a length of approximately 7 to 8 mm.  This vessel was then cut sharply.  The temporary clip was then removed and no active bleeding was noted.  I did perform another ICG video angiography and obviously did not show any flow.  At this point, the wound was irrigated with normal saline.  No intradural bleeding was identified.  The dura was then closed with a running 4-0 Nurolon stitch.  Hemostasis was again secured on the epidural plane with morselized Gelfoam and thrombin.  Valsalva maneuver was performed and no active CSF egress was noted.  The suture line was covered with a layer of polyethylene glycol dural sealant.  Self-retaining retractors were then removed and hemostasis was secured on the muscular edges with bipolar.  Wound was then closed in multiple layers in standard fashion using a combination of interrupted 0 and 3-0 Vicryl stitches.  Dermabond was applied.  Once this dried a sterile dressing was placed.  Patient was then transferred to the stretcher, extubated, and taken to the postanesthesia care unit in stable hemodynamic condition.  At the end of the case all sponge, needle, instrument, and cottonoid counts were correct.   Lisbeth Renshaw, MD [facility]

## 2021-03-01 MED ORDER — METHOCARBAMOL 500 MG PO TABS: 500.0000 mg | ORAL_TABLET | Freq: Four times a day (QID) | ORAL | 0 refills | Status: AC | PRN

## 2021-03-01 MED ORDER — DOCUSATE SODIUM 100 MG PO CAPS: 100.0000 mg | ORAL_CAPSULE | Freq: Two times a day (BID) | ORAL | 0 refills | Status: AC

## 2021-03-01 MED ORDER — OXYCODONE-ACETAMINOPHEN 5-325 MG PO TABS: 1.0000 | ORAL_TABLET | ORAL | Status: DC | PRN

## 2021-03-01 MED ORDER — OXYCODONE-ACETAMINOPHEN 5-325 MG PO TABS: 1.0000 | ORAL_TABLET | ORAL | 0 refills | Status: AC | PRN

## 2021-03-01 NOTE — Progress Notes (Signed)
Patient awaiting transport for discharge home; in no acute distress nor complaints of pain nor discomfort; incision on his back with honeycomb dressing and is clean, dry and intact; room was checked and accounted for all his belongings; discharge instructions concerning his medications, incision care, follow up appointment and when to call the doctor as needed were all discussed with patient and he expressed understanding on the instructions given.

## 2021-03-01 NOTE — Evaluation (Signed)
Physical Therapy Evaluation Patient Details Name: Sean Mcdaniel MRN: [id number] DOB: 1970/10/23 Today's Date: 03/01/2021  History of Present Illness  51 y.o. male presents to [facility] hospital on 02/28/2021 with progressive LE weakness and paresthesias. Spinal angiogram reveals dural fistula at L L3 radial. Pt underwent L2-3 laminectomy and ligation of L3 dural AV fistula on 1/27. No PMH noted.  Clinical Impression  Pt presents to PT mobilizing independently at this time. PT provides education on back precautions and pt expresses understanding. Pt denies concerns about mobility at this time and demonstrates no further acute PT needs. Acute PT signing off.       Recommendations for follow up therapy are one component of a multi-disciplinary discharge planning process, led by the attending physician.  Recommendations may be updated based on patient status, additional functional criteria and insurance authorization.  Follow Up Recommendations No PT follow up    Assistance Recommended at Discharge None  Patient can return home with the following       Equipment Recommendations None recommended by PT  Recommendations for Other Services       Functional Status Assessment Patient has not had a recent decline in their functional status     Precautions / Restrictions Precautions Precautions: Back Precaution Booklet Issued: Yes (comment) Required Braces or Orthoses:  (no brace needed per orders) Restrictions Weight Bearing Restrictions: No      Mobility  Bed Mobility                    Transfers Overall transfer level:  (pt received standing in room)                      Ambulation/Gait Ambulation/Gait assistance: Independent Gait Distance (Feet): 300 Feet Assistive device: None Gait Pattern/deviations: WFL(Within Functional Limits) Gait velocity: functional Gait velocity interpretation: >4.37 ft/sec, indicative of normal walking speed   General Gait Details: steady  step-through gait  Stairs Stairs:  (pt declines the need for stair assessment)          Wheelchair Mobility    Modified Rankin (Stroke Patients Only)       Balance Overall balance assessment: Independent                                           Pertinent Vitals/Pain Pain Assessment Pain Assessment: Faces Faces Pain Scale: Hurts little more Pain Location: low back Pain Descriptors / Indicators: Grimacing Pain Intervention(s): Monitored during session    Home Living Family/patient expects to be discharged to:: Private residence Living Arrangements: Children;Spouse/significant other Available Help at Discharge: Family Type of Home: House Home Access: Stairs to enter Entrance Stairs-Rails: Can reach both Entrance Stairs-Number of Steps: 3   Home Layout: One level Home Equipment: None      Prior Function Prior Level of Function : Independent/Modified Independent;Working/employed;Driving             Mobility Comments: works as a Therapist, nutritional Extremity Assessment Upper Extremity Assessment: Overall WFL for tasks assessed    Lower Extremity Assessment Lower Extremity Assessment: Overall WFL for tasks assessed    Cervical / Trunk Assessment Cervical / Trunk Assessment: Back Surgery  Communication   Communication: No difficulties  Cognition Arousal/Alertness: Awake/alert Behavior During Therapy: WFL for tasks assessed/performed Overall  Cognitive Status: Within Functional Limits for tasks assessed                                          General Comments General comments (skin integrity, edema, etc.): VSS on RA    Exercises     Assessment/Plan    PT Assessment Patient does not need any further PT services  PT Problem List         PT Treatment Interventions      PT Goals (Current goals can be found in the Care Plan section)       Frequency        Co-evaluation               AM-PAC PT "6 Clicks" Mobility  Outcome Measure Help needed turning from your back to your side while in a flat bed without using bedrails?: None Help needed moving from lying on your back to sitting on the side of a flat bed without using bedrails?: None Help needed moving to and from a bed to a chair (including a wheelchair)?: None Help needed standing up from a chair using your arms (e.g., wheelchair or bedside chair)?: None Help needed to walk in hospital room?: None Help needed climbing 3-5 steps with a railing? : None 6 Click Score: 24    End of Session   Activity Tolerance: Patient tolerated treatment well Patient left: in bed;with call bell/phone within reach Nurse Communication: Mobility status PT Visit Diagnosis: Other abnormalities of gait and mobility (R26.89)    Time: [id number] PT Time Calculation (min) (ACUTE ONLY): 10 min   Charges:   PT Evaluation $PT Eval Low Complexity: 1 Low          Zenaida Niece, PT, DPT Acute Rehabilitation Pager: [phone] Office [phone]   Zenaida Niece 03/01/2021, 9:47 AM

## 2021-03-01 NOTE — TOC Transition Note (Signed)
Transition of Care [facility]) - CM/SW Discharge Note   Patient Details  Name: Sean Mcdaniel MRN: [id number] Date of Birth: 1970/10/16  Transition of Care [facility]) CM/SW Contact:  Lawerance Sabal, RN Phone Number: 03/01/2021, 9:09 AM   Clinical Narrative:    DC to home. No HH needs, unit staff to supply any needed DME form floorstock.     Final next level of care: Home/Self Care Barriers to Discharge: No Barriers Identified   Patient Goals and CMS Choice        Discharge Placement                       Discharge Plan and Services                                     Social Determinants of Health (SDOH) Interventions     Readmission Risk Interventions No flowsheet data found.

## 2021-03-01 NOTE — Discharge Summary (Signed)
Physician Discharge Summary  Patient ID: Sean Mcdaniel MRN: [id number] DOB/AGE: [birth year] 51 y.o.  Admit date: 02/28/2021 Discharge date: 03/01/2021  Admission Diagnoses: Spinal dural AV fistula  Discharge Diagnoses:  Principal Problem:   Dural arteriovenous fistula of spinal cord [facility])   Discharged Condition: good  Hospital Course: Dr. Conchita Paris performed a resection of lumbar spinal dural AV fistula on the patient on 02/28/2021.  The patient's postoperative course was unremarkable.  On postoperative day #1 he requested discharge to home.  He was given verbal and written discharge instructions.  All his questions were answered.  Consults: PT, OT, care management Significant Diagnostic Studies: None Treatments: Resection of lumbar dural AV fistula Discharge Exam: Blood pressure (!) 142/82, pulse 76, temperature 98 F (36.7 C), temperature source Oral, resp. rate 17, height 5\' 9"  (1.753 m), weight 112 kg, SpO2 99 %. The patient is alert and pleasant.  He looks well.  His dressing is clean and dry.  His strength is normal.  Disposition: Home  Discharge Instructions     Call MD for:  difficulty breathing, headache or visual disturbances   Complete by: As directed    Call MD for:  extreme fatigue   Complete by: As directed    Call MD for:  hives   Complete by: As directed    Call MD for:  persistant dizziness or light-headedness   Complete by: As directed    Call MD for:  persistant nausea and vomiting   Complete by: As directed    Call MD for:  redness, tenderness, or signs of infection (pain, swelling, redness, odor or green/yellow discharge around incision site)   Complete by: As directed    Call MD for:  severe uncontrolled pain   Complete by: As directed    Call MD for:  temperature >100.4   Complete by: As directed    Diet - low sodium heart healthy   Complete by: As directed    Discharge instructions   Complete by: As directed    Call [phone] for a followup  appointment. Take a stool softener while you are using pain medications.   Driving Restrictions   Complete by: As directed    Do not drive for 2 weeks.   Increase activity slowly   Complete by: As directed    Lifting restrictions   Complete by: As directed    Do not lift more than 5 pounds. No excessive bending or twisting.   May shower / Bathe   Complete by: As directed    Remove the dressing for 3 days after surgery.  You may shower, but leave the incision alone.   Remove dressing in 48 hours   Complete by: As directed       Allergies as of 03/01/2021   No Known Allergies      Medication List     STOP taking these medications    diclofenac 50 MG EC tablet Commonly known as: VOLTAREN   ibuprofen 200 MG tablet Commonly known as: ADVIL       TAKE these medications    docusate sodium 100 MG capsule Commonly known as: COLACE Take 1 capsule (100 mg total) by mouth 2 (two) times daily.   methocarbamol 500 MG tablet Commonly known as: ROBAXIN Take 1 tablet (500 mg total) by mouth every 6 (six) hours as needed for muscle spasms.   multivitamin tablet Take 1 tablet by mouth daily.   oxyCODONE-acetaminophen 5-325 MG tablet Commonly known as: PERCOCET/ROXICET Take 1-2 tablets by  mouth every 4 (four) hours as needed for moderate pain.         Signed: Cristi Loron 03/01/2021, 8:35 AM

## 2021-03-01 NOTE — Evaluation (Signed)
Occupational Therapy Evaluation and Discharge Patient Details Name: Sean Mcdaniel MRN: [id number] DOB: February 28, 1970 Today's Date: 03/01/2021   History of Present Illness 51 y.o. male [facility] hospital on 02/28/2021 with progressive LE weakness and paresthesias. Spinal angiogram reveals dural fistula at L L3 radial. Pt underwent L2-3 laminectomy and ligation of L3 dural AV fistula on 1/27. No PMH noted.   Clinical Impression   Pt is functioning modified independently. Educated in compensatory strategies for ADL, IADL to avoid and body mechanics. Pt verbalized understanding. He has a supportive family at home. No further OT needs.      Recommendations for follow up therapy are one component of a multi-disciplinary discharge planning process, led by the attending physician.  Recommendations may be updated based on patient status, additional functional criteria and insurance authorization.   Follow Up Recommendations  No OT follow up    Assistance Recommended at Discharge PRN  Patient can return home with the following Assistance with cooking/housework    Functional Status Assessment  Patient has had a recent decline in their functional status and demonstrates the ability to make significant improvements in function in a reasonable and predictable amount of time.  Equipment Recommendations  None recommended by OT    Recommendations for Other Services       Precautions / Restrictions Precautions Precautions: Back Precaution Booklet Issued: Yes (comment) Required Braces or Orthoses:  (no brace needed per orders) Restrictions Weight Bearing Restrictions: No      Mobility Bed Mobility Overal bed mobility: Modified Independent                  Transfers Overall transfer level: Independent Equipment used: None                      Balance Overall balance assessment: Independent                                         ADL either performed or  assessed with clinical judgement   ADL Overall ADL's : Modified independent                                       General ADL Comments: Educated in compensatory strategies for ADLs, body mechanics and IADLs to avoid.     Vision Baseline Vision/History: 0 No visual deficits       Perception     Praxis      Pertinent Vitals/Pain Pain Assessment Faces Pain Scale: Hurts little more Pain Location: low back Pain Descriptors / Indicators: Grimacing Pain Intervention(s): Monitored during session, Premedicated before session     Hand Dominance Right   Extremity/Trunk Assessment Upper Extremity Assessment Upper Extremity Assessment: Overall WFL for tasks assessed   Lower Extremity Assessment Lower Extremity Assessment: Defer to PT evaluation   Cervical / Trunk Assessment Cervical / Trunk Assessment: Back Surgery   Communication Communication Communication: No difficulties   Cognition Arousal/Alertness: Awake/alert Behavior During Therapy: WFL for tasks assessed/performed Overall Cognitive Status: Within Functional Limits for tasks assessed                                       General Comments  VSS on RA  Exercises     Shoulder Instructions      Home Living Family/patient expects to be discharged to:: Private residence Living Arrangements: Children;Spouse/significant other Available Help at Discharge: Family Type of Home: House Home Access: Stairs to enter Secretary/administrator of Steps: 3 Entrance Stairs-Rails: Can reach both Home Layout: One level     Bathroom Shower/Tub: Producer, television/film/video: Standard     Home Equipment: None          Prior Functioning/Environment Prior Level of Function : Independent/Modified Independent;Working/employed;Driving             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 List:        OT Treatment/Interventions:      OT Goals(Current goals  can be found in the care plan section)    OT Frequency:      Co-evaluation              AM-PAC OT "6 Clicks" Daily Activity     Outcome Measure Help from another person eating meals?: None Help from another person taking care of personal grooming?: None Help from another person toileting, which includes using toliet, bedpan, or urinal?: None Help from another person bathing (including washing, rinsing, drying)?: None Help from another person to put on and taking off regular upper body clothing?: None Help from another person to put on and taking off regular lower body clothing?: None 6 Click Score: 24   End of Session    Activity Tolerance: Patient tolerated treatment well Patient left: in bed;with call bell/phone within reach  OT Visit Diagnosis: Pain                Time: 1308-6578 OT Time Calculation (min): 13 min Charges:  OT General Charges $OT Visit: 1 Visit OT Evaluation $OT Eval Low Complexity: 1 Low  Sean Mcdaniel, OTR/L Acute Rehabilitation Services Pager: [phone] Office: [phone]  Sean Mcdaniel 03/01/2021, 11:21 AM

## 2021-03-01 NOTE — Plan of Care (Signed)

## 2021-03-01 NOTE — Anesthesia Postprocedure Evaluation (Signed)
Anesthesia Post Note  Patient: Sean Mcdaniel  Procedure(s) Performed: LUMBAR THREE LAMINECTOMY,DISCONNECTION OF LEFT LUMBAR THREE FISTULA (Left)     Patient location during evaluation: PACU Anesthesia Type: General Level of consciousness: sedated and patient cooperative Pain management: pain level controlled Vital Signs Assessment: post-procedure vital signs reviewed and stable Respiratory status: spontaneous breathing Cardiovascular status: stable Anesthetic complications: no   No notable events documented.  Last Vitals:  Vitals:   03/01/21 0500 03/01/21 0735  BP: 140/72 (!) 142/82  Pulse: 73 76  Resp: 18 17  Temp: 36.9 C 36.7 C  SpO2: 100% 99%    Last Pain:  Vitals:   03/01/21 0735  TempSrc: Oral  PainSc: 2                  Lewie Loron

## 2021-03-02 ENCOUNTER — Encounter (HOSPITAL_COMMUNITY): Payer: Self-pay | Admitting: Neurosurgery
